# Patient Record
Sex: Female | Born: 1980 | Hispanic: No | Marital: Married | Smoking: Never smoker
Health system: Southern US, Community
[De-identification: ages and names within clinical notes are randomized; demographics above are authoritative.]

## PROBLEM LIST (undated history)

## (undated) DIAGNOSIS — Z3402 Encounter for supervision of normal first pregnancy, second trimester: Principal | ICD-10-CM

## (undated) HISTORY — PX: NO PAST SURGERIES: SHX2092

## (undated) HISTORY — DX: Encounter for supervision of normal first pregnancy, second trimester: Z34.02

---

## 2016-07-03 ENCOUNTER — Encounter (INDEPENDENT_AMBULATORY_CARE_PROVIDER_SITE_OTHER): Payer: Self-pay

## 2016-07-03 ENCOUNTER — Ambulatory Visit (INDEPENDENT_AMBULATORY_CARE_PROVIDER_SITE_OTHER): Payer: Self-pay | Admitting: Internal Medicine

## 2016-07-03 ENCOUNTER — Encounter: Payer: Self-pay | Admitting: Internal Medicine

## 2016-07-03 DIAGNOSIS — IMO0001 Reserved for inherently not codable concepts without codable children: Secondary | ICD-10-CM | POA: Insufficient documentation

## 2016-07-03 DIAGNOSIS — Z30011 Encounter for initial prescription of contraceptive pills: Secondary | ICD-10-CM

## 2016-07-03 DIAGNOSIS — Z308 Encounter for other contraceptive management: Secondary | ICD-10-CM

## 2016-07-03 DIAGNOSIS — Z23 Encounter for immunization: Secondary | ICD-10-CM

## 2016-07-03 DIAGNOSIS — Z0000 Encounter for general adult medical examination without abnormal findings: Secondary | ICD-10-CM | POA: Insufficient documentation

## 2016-07-03 MED ORDER — NORGESTIM-ETH ESTRAD TRIPHASIC 0.18/0.215/0.25 MG-25 MCG PO TABS: 1.0000 | ORAL_TABLET | Freq: Every day | ORAL | 11 refills | Status: DC

## 2016-07-03 NOTE — Assessment & Plan Note (Signed)
Patient given flu shot today.   She is currently without insurance and working towards an orange card. Will need to discuss screening for Hepatitis B/C and HIV when she has obtained insurance or the orange card.

## 2016-07-03 NOTE — Patient Instructions (Addendum)
It was a pleasure to meet you Brenda Tate.  I am glad you are feeling well. I am refilling your birth control pills at the [facility] on [address].  Please follow up with [place] in 1 year or sooner if needed.

## 2016-07-03 NOTE — Progress Notes (Signed)
   CC: Establish Care  HPI:  [name] is a 35 y.o. [place] speaking female from [place] who presents as a new patient to establish care with our clinic. Communication is with the assistance of a remote translator.  She has no known prior medical conditions or complaints at this time. Her only medication is oral contraceptive pills, for which she is requesting a prescription.  Birth Control: Patient reports taking OCP prescribed by her former doctor in [place]. She takes the pill daily for 21 days with a 7 day break. She is a never smoker without history of blood clots. She has had two normal pregnancies [id number]   Healthcare Maintenance: Patient given flu shot today.  Social Hx: Lives in [place] with her husband. Moved from [place] 2-3 months ago. She is not working. She denies any tobacco, alcohol, or illicit drug use.   Surgical Hx: Denies any prior surgical history.  Family Hx: Denies any known family history of heart disease, lung disease, or cancer.  Allergies: No known drug allergies.   History reviewed. No pertinent past medical history.  Review of Systems:   Review of Systems  Constitutional: Negative for chills, diaphoresis, fever and weight loss.  Respiratory: Negative for cough, sputum production and shortness of breath.   Cardiovascular: Negative for chest pain, palpitations and leg swelling.  Gastrointestinal: Negative for abdominal pain, constipation, diarrhea, heartburn, nausea and vomiting.  Genitourinary: Negative for dysuria and hematuria.  Musculoskeletal: Negative for falls and myalgias.  Skin: Negative for rash.  Neurological: Negative for dizziness, tingling, sensory change, loss of consciousness and headaches.  Psychiatric/Behavioral: Negative for substance abuse.     Physical Exam:  Vitals:   07/03/16 0941  BP: 108/64  Pulse: 81  Temp: 97.8 F (36.6 C)  TempSrc: Oral  SpO2: 100%  Weight: 172 lb 11.2 oz (78.3 kg)    Height: 5\' 6"  (1.676 m)   Physical Exam  Constitutional: She is oriented to person, place, and time. She appears well-developed and well-nourished. No distress.  HENT:  Head: Normocephalic and atraumatic.  Eyes: EOM are normal.  Cardiovascular: Normal rate and regular rhythm.  Exam reveals no friction rub.   No murmur heard. Pulmonary/Chest: Effort normal. No respiratory distress. She has no wheezes. She has no rales.  Abdominal: Soft. Bowel sounds are normal. She exhibits no distension. There is no tenderness. There is no guarding.  Musculoskeletal: Normal range of motion. She exhibits no edema or tenderness.  Neurological: She is alert and oriented to person, place, and time.  Skin: Skin is warm. Capillary refill takes less than 2 seconds. No rash noted. She is not diaphoretic. No erythema.    Assessment & Plan:   See Encounters Tab for problem based charting.  Patient discussed with Dr. Lynnae January

## 2016-07-03 NOTE — Assessment & Plan Note (Signed)
Patient reports taking OCP prescribed by her former doctor in [place]. She takes the pill daily for 21 days with a 7 day break. She is a never smoker without history of blood clots. She has had two normal pregnancies [id number]  Prescription for Norgestimate-Ethinyl Estradiol Triphasic sent to [facility] on [address].  Patient and husband requesting referral to Gynecology, however prefer to wait until they obtain the orange card/insurance.

## 2016-07-04 NOTE — Progress Notes (Signed)
Internal Medicine Clinic Attending  Case discussed with Dr. Patel,Vishal at the time of the visit.  We reviewed the resident's history and exam and pertinent patient test results.  I agree with the assessment, diagnosis, and plan of care documented in the resident's note.  

## 2016-07-07 ENCOUNTER — Ambulatory Visit: Payer: Self-pay

## 2016-08-12 ENCOUNTER — Ambulatory Visit: Payer: Self-pay

## 2016-10-23 ENCOUNTER — Encounter: Payer: Self-pay | Admitting: Internal Medicine

## 2016-10-23 NOTE — Progress Notes (Deleted)
   CC: ***  HPI:  [name] is a 36 y.o. female with past medical history outlined below here for ***. For the details of today's visit, please refer to the assessment and plan.  No past medical history on file.  Review of Systems:  All pertinents listed in HPI, otherwise negative  Physical Exam:  There were no vitals filed for this visit.  Constitutional: NAD, appears comfortable HEENT: Atraumatic, normocephalic. PERRL, anicteric sclera.  Neck: Supple, trachea midline.  Cardiovascular: RRR, no murmurs, rubs, or gallops.  Pulmonary/Chest: CTAB, no wheezes, rales, or rhonchi. No chest wall abnormalities.  Abdominal: Soft, non tender, non distended. +BS.  GU: ***  Extremities: Warm and well perfused. Distal pulses intact. No edema.  Neurological: A&Ox3, CN II - XII grossly intact.  Skin: No rashes or erythema  Psychiatric: Normal mood and affect  Assessment & Plan:   See Encounters Tab for problem based charting.  Patient {GC/GE:[id number]::"discussed with","seen with"} Dr. {NAMES:[id number]::"Butcher","Granfortuna","E. Hoffman","Klima","Mullen","Narendra","Vincent"}

## 2016-12-09 ENCOUNTER — Ambulatory Visit (INDEPENDENT_AMBULATORY_CARE_PROVIDER_SITE_OTHER): Payer: Self-pay | Admitting: Internal Medicine

## 2016-12-09 ENCOUNTER — Encounter: Payer: Self-pay | Admitting: Internal Medicine

## 2016-12-09 VITALS — BP 96/57 | HR 80 | Temp 97.5°F | Ht 66.0 in | Wt 178.6 lb

## 2016-12-09 DIAGNOSIS — Z3041 Encounter for surveillance of contraceptive pills: Secondary | ICD-10-CM

## 2016-12-09 DIAGNOSIS — N941 Unspecified dyspareunia: Secondary | ICD-10-CM

## 2016-12-09 MED ORDER — NORGESTIM-ETH ESTRAD TRIPHASIC 0.18/0.215/0.25 MG-25 MCG PO TABS: 1.0000 | ORAL_TABLET | Freq: Every day | ORAL | 11 refills | Status: DC

## 2016-12-09 MED FILL — TRI-LO-MARZIA TABLET: 0.18/0.215/ | 28 days supply | Qty: 28 | Fill #0

## 2016-12-09 NOTE — Patient Instructions (Signed)
Ms. Heckstall,  It was a pleasure to see you today. I will call you with the results of your testing today. I have placed a referral to gynecology. They will call you for an appointment. If you have any questions or concerns, call our clinic at [phone] or after hours call [phone] and ask for the internal medicine resident on call. Thank you!  - Dr. Philipp Ovens             .      .      .      .            [phone]     [phone]       .  !  -  ()  alsayidat murabati , kan min dawaei saruriin 'an 'arak alyawma. sawf 'atasil bik binatayij aikhtibarik alyawma. laqad wadaeat al'iihalat 'iilaa tbi alnasa'. sawf yatasil bik lilhusul ealaa mueid. 'iidha kan ladayk 'ay 'asyilat 'aw aistifsarat , 'atasil eiadatana ealaa [phone] 'aw baed saeat alaitisal [phone] watlub min tabib al'iiqamat alddakhilii eind altalab. shukraan lakam! - alduktur (jylud)

## 2016-12-09 NOTE — Assessment & Plan Note (Signed)
Refilled birth control

## 2016-12-09 NOTE — Assessment & Plan Note (Signed)
Patient speaks Arabic and history was obtained with the help of a Optometrist.  She is here today with complaint of painful intercourse. This has been ongoing for the past 1 year and progressively worsening. She endorses pain in her lower abdomen/suprapubic region with intercourse. She denies any vaginal discharge or abnormal bleeding. She reports she was diagnosed with a small fibroid approximately 5 years ago in her home country. She has never had a Pap smear. Physical exam today was unremarkable. She did have some dark red/brown discharge from her cervical os but no obvious lesions or abnormalities. She tolerated the exam well without significant pain.  -- Gynecology referral for fibroids  -- PAP smear today -- GC/Chlamydia, wet prep  -- Refilled birth control

## 2016-12-09 NOTE — Progress Notes (Signed)
   CC: Pain with intercourse   HPI:  Ms.Khaleah Vignola is a 36 y.o. female with past medical history outlined below here for pain with intercourse. For the details of today's visit, please refer to the assessment and plan.  No past medical history on file.  Review of Systems:  All pertinents listed in HPI, otherwise negative  Physical Exam:  Vitals:   12/09/16 1608  BP: (!) 96/57  Pulse: 80  Temp: 97.5 F (36.4 C)  TempSrc: Oral  SpO2: 99%  Weight: 178 lb 9.6 oz (81 kg)  Height: 5\' 6"  (1.676 m)    Constitutional: NAD, appears comfortable Cardiovascular: RRR  Pulmonary/Chest: CTAB Abdominal: Soft, non tender, non distended. +BS.  GU: Normal external genitalia. Cervix adequately visualized, no obviously lesions or abnormalities. +Dark brown/red discharge.  Extremities: Warm and well perfused. Distal pulses intact. No edema.  Neurological: A&Ox3, CN II - XII grossly intact.   Assessment & Plan:   See Encounters Tab for problem based charting.  Patient discussed with Dr. Angelia Mould

## 2016-12-10 ENCOUNTER — Ambulatory Visit: Payer: Self-pay

## 2016-12-10 LAB — HIV ANTIBODY (ROUTINE TESTING W REFLEX): HIV Screen 4th Generation wRfx: NONREACTIVE

## 2016-12-10 LAB — CERVICOVAGINAL ANCILLARY ONLY
Chlamydia: NEGATIVE
NEISSERIA GONORRHEA: NEGATIVE
WET PREP (BD AFFIRM): NEGATIVE

## 2016-12-10 LAB — RPR: RPR Ser Ql: NONREACTIVE

## 2016-12-10 NOTE — Progress Notes (Signed)
Internal Medicine Clinic Attending  Case discussed with Dr. Guilloud at the time of the visit.  We reviewed the resident's history and exam and pertinent patient test results.  I agree with the assessment, diagnosis, and plan of care documented in the resident's note.  

## 2016-12-11 ENCOUNTER — Telehealth: Payer: Self-pay | Admitting: *Deleted

## 2016-12-11 LAB — CYTOLOGY - PAP: DIAGNOSIS: NEGATIVE

## 2016-12-11 NOTE — Telephone Encounter (Signed)
-----   Message from Velna Ochs, MD sent at 12/11/2016  3:20 PM EDT ----- Please call this patient (speaks Arabic, will need translator) and let her know that all of her testing was normal. Her PAP smear (cervical cancer screening test) was negative and we can repeat this again in 5 years. There was no evidence of infection to explain her pain. Her pain is likely from her fibroids. I have referred her to gynecology. Please ask her to follow up with OBGYN for her fibroids. Thanks!   - Dr. Philipp Ovens

## 2016-12-11 NOTE — Telephone Encounter (Signed)
No answer when called 

## 2016-12-17 ENCOUNTER — Encounter: Payer: Self-pay | Admitting: Internal Medicine

## 2016-12-18 ENCOUNTER — Encounter: Payer: Self-pay | Admitting: Internal Medicine

## 2017-01-20 NOTE — Addendum Note (Signed)
Addended by: Hulan Fray on: 01/20/2017 08:42 AM   Modules accepted: Orders

## 2017-02-16 ENCOUNTER — Ambulatory Visit (INDEPENDENT_AMBULATORY_CARE_PROVIDER_SITE_OTHER): Payer: Self-pay | Admitting: Obstetrics & Gynecology

## 2017-02-16 ENCOUNTER — Encounter: Payer: Self-pay | Admitting: Obstetrics & Gynecology

## 2017-02-16 VITALS — BP 129/89 | HR 87 | Wt 179.9 lb

## 2017-02-16 DIAGNOSIS — N941 Unspecified dyspareunia: Secondary | ICD-10-CM

## 2017-02-16 NOTE — Progress Notes (Signed)
   Subjective:    Patient ID: Brenda Tate, female    DOB: 08-11-1980, 36 y.o.   MRN: [id number]  HPI 36 yo M P2 (61 and 49 yo daughters) Arabic-speaking lady here telling me "I would like an u/s". She reports a "pain in my belly" with intercourse for a year. She uses nothing for contraception. She would like a pregnancy. Her husband tells me that she was "diagnosed with fibroids" several years ago.   Review of Systems Pap smear normal 2018    Objective:   Physical Exam Moderately obese A FNAD Breathing, conversing, and ambulating normally Video interpretor used for exam       Assessment & Plan:  Desire for pregnancy- rec MVI to help prevent ONTDs Dyspareunia and h/o fibroids- order gyn u/s RTC 4 weeks

## 2017-02-16 NOTE — Progress Notes (Signed)
[place] scheduled for July 27th @ 0800.  Pt notified.

## 2017-02-16 NOTE — Progress Notes (Signed)
Video Interperter # [id number]

## 2017-02-18 ENCOUNTER — Ambulatory Visit: Payer: Self-pay

## 2017-02-20 ENCOUNTER — Ambulatory Visit (HOSPITAL_COMMUNITY): Payer: Self-pay

## 2017-02-25 ENCOUNTER — Ambulatory Visit: Payer: Self-pay

## 2017-02-25 ENCOUNTER — Telehealth: Payer: Self-pay | Admitting: *Deleted

## 2017-02-25 NOTE — Progress Notes (Signed)
Patient here today accompanied by husband. Had questions about test results done last visit on 12/09/16. Interpreter used ( Husam [id number]), form signed. Questions were answered.

## 2017-03-02 ENCOUNTER — Ambulatory Visit: Payer: Self-pay

## 2017-03-16 ENCOUNTER — Ambulatory Visit: Payer: Self-pay | Admitting: Obstetrics & Gynecology

## 2017-03-24 ENCOUNTER — Ambulatory Visit (HOSPITAL_COMMUNITY)
Admission: RE | Admit: 2017-03-24 | Discharge: 2017-03-24 | Disposition: A | Discharge status: Home / Self Care | Payer: Self-pay | Source: Ambulatory Visit | Attending: Obstetrics & Gynecology | Admitting: Obstetrics & Gynecology

## 2017-03-24 DIAGNOSIS — N941 Unspecified dyspareunia: Secondary | ICD-10-CM | POA: Insufficient documentation

## 2017-03-24 DIAGNOSIS — D251 Intramural leiomyoma of uterus: Secondary | ICD-10-CM | POA: Insufficient documentation

## 2017-04-13 ENCOUNTER — Encounter: Payer: Self-pay | Admitting: Obstetrics & Gynecology

## 2017-04-13 ENCOUNTER — Ambulatory Visit (INDEPENDENT_AMBULATORY_CARE_PROVIDER_SITE_OTHER): Payer: Self-pay | Admitting: Obstetrics & Gynecology

## 2017-04-13 VITALS — BP 106/55 | HR 84 | Wt 175.0 lb

## 2017-04-13 DIAGNOSIS — N941 Unspecified dyspareunia: Secondary | ICD-10-CM

## 2017-04-13 NOTE — Progress Notes (Signed)
   Subjective:    Patient ID: Brenda Tate, female    DOB: 07-20-1981, 36 y.o.   MRN: [id number]  HPI  36 yo married Arabic-speaking G0 here for follow up after an u/s done for deep dyspareunia. She says (with video intrepretor) that this has improved.  Review of Systems     Objective:   Physical Exam Well nourished, well hydrated Asian female, no apparent distress Breathing, conversing, and ambulating normally      Assessment & Plan:  Dyspareunia- now resolved. She had no other concerns or questions.

## 2017-06-15 ENCOUNTER — Ambulatory Visit: Payer: Self-pay

## 2017-06-22 ENCOUNTER — Ambulatory Visit: Payer: Self-pay

## 2017-07-06 ENCOUNTER — Encounter: Payer: Self-pay | Admitting: Internal Medicine

## 2017-09-28 ENCOUNTER — Encounter: Payer: Self-pay | Admitting: Internal Medicine

## 2017-10-19 ENCOUNTER — Encounter: Payer: Self-pay | Admitting: Internal Medicine

## 2017-12-04 ENCOUNTER — Ambulatory Visit: Payer: Self-pay

## 2017-12-24 ENCOUNTER — Ambulatory Visit: Payer: Self-pay

## 2018-01-11 ENCOUNTER — Encounter: Payer: Self-pay | Admitting: Internal Medicine

## 2018-01-11 ENCOUNTER — Telehealth: Payer: Self-pay | Admitting: Dietician

## 2018-01-11 NOTE — Telephone Encounter (Signed)
opened in error

## 2018-01-12 ENCOUNTER — Ambulatory Visit (INDEPENDENT_AMBULATORY_CARE_PROVIDER_SITE_OTHER): Payer: Self-pay | Admitting: Internal Medicine

## 2018-01-12 ENCOUNTER — Other Ambulatory Visit: Payer: Self-pay

## 2018-01-12 ENCOUNTER — Encounter: Payer: Self-pay | Admitting: Dietician

## 2018-01-12 VITALS — BP 98/66 | HR 66 | Temp 97.5°F | Ht 65.0 in | Wt 181.0 lb

## 2018-01-12 DIAGNOSIS — Z0000 Encounter for general adult medical examination without abnormal findings: Secondary | ICD-10-CM

## 2018-01-12 DIAGNOSIS — K5900 Constipation, unspecified: Secondary | ICD-10-CM

## 2018-01-12 DIAGNOSIS — Z973 Presence of spectacles and contact lenses: Secondary | ICD-10-CM

## 2018-01-12 DIAGNOSIS — H5213 Myopia, bilateral: Secondary | ICD-10-CM | POA: Insufficient documentation

## 2018-01-12 DIAGNOSIS — Z23 Encounter for immunization: Secondary | ICD-10-CM

## 2018-01-12 MED ORDER — POLYETHYLENE GLYCOL 3350 17 G PO PACK: 17.0000 g | PACK | Freq: Every day | ORAL | 0 refills | Status: DC

## 2018-01-12 MED ORDER — DOCUSATE SODIUM 100 MG PO CAPS: 100.0000 mg | ORAL_CAPSULE | Freq: Two times a day (BID) | ORAL | 0 refills | Status: DC

## 2018-01-12 NOTE — Assessment & Plan Note (Signed)
She reports that she has a history of myopia and she has worn glasses for this for the last 20 years.  She reports she last had her eyes checked 2 years ago.  She is requesting a referral to ophthalmology to have her eyes examined.  Ophthalmology referral placed today.

## 2018-01-12 NOTE — Patient Instructions (Signed)
Ms. Erisman,  I would encourage you to increase your fluid intake to at least 2 to 3 L of water a day.  I am also going to start you on a stool softener called Colace as well as a laxative called MiraLAX.  I want you to take these and to start to have regular bowel movements.  When she started having regular bowel movements you can back off on the medications and only use them when you need them if you are not having bowel movements.  I am also going to check some labs today to make sure there is nothing else that could cause her constipation.  If you do not see any improvement in her constipation in the next 2 to 3 weeks or you have worsening symptoms please give [place] a call and we can try other things that may help.  Otherwise, if you are doing well have no further problems he can follow-up with [place] in a year for an annual check.           2  3      .        Colace     MiraLAX.           Marland Kitchen                      .                  .          2  3                 .                        Marland Kitchen

## 2018-01-12 NOTE — Assessment & Plan Note (Addendum)
See HPI for full details.  Recommended increasing her water intake to at least 2 to 3 L of water daily.  Also sent in for Colace and MiraLAX.  Provided her instructions and once her constipation is improved she can  take them as needed she has further issues with constipation.  This plan will check basic lab work including TSH today.

## 2018-01-12 NOTE — Progress Notes (Signed)
Internal Medicine Clinic Attending  Case discussed with Dr. Boswell at the time of the visit.  We reviewed the resident's history and exam and pertinent patient test results.  I agree with the assessment, diagnosis, and plan of care documented in the resident's note.  

## 2018-01-12 NOTE — Assessment & Plan Note (Signed)
Patient received Tdap today. 

## 2018-01-12 NOTE — Progress Notes (Signed)
   CC: Constipation  HPI:  Ms.Graciana Paff is a 37 y.o. female with no past medical history presenting today with complaints of constipation.  She reports that for the past year she has been having bowel movements weekly.  She reports that her bowel movements are hard, firm, somewhat painful.  She also notes that when she wipes with the toilet paper or she notices occasional specks of blood.  She denies any overt blood with her stools and denies any melena.  Denies any history previously of issues with constipation.  She does not take any medications.  She reports to me today that she drinks 2 to 3 cups of water or soft drink daily.  She denies any abdominal pain or bloating, nausea/vomiting. She denies any weight gain, brittle hair, fatigue/malaise or any other symptoms today.  She has not tried anything for her constipation.   No past medical history on file.   Review of Systems: Negative except as noted in HPI  Physical Exam:  Vitals:   01/12/18 1025  BP: 98/66  Pulse: 66  Temp: (!) 97.5 F (36.4 C)  TempSrc: Oral  SpO2: 99%  Weight: 181 lb (82.1 kg)  Height: 5\' 5"  (1.651 m)   GENERAL- alert, co-operative, appears as stated age, not in any distress. HEENT- Atraumatic, normocephalic CARDIAC- RRR, no murmurs, rubs or gallops. RESP- Clear to auscultation bilaterally, no wheezes or crackles. ABDOMEN- Soft, nontender, bowel sounds present. EXTREMITIES- pulse 2+, symmetric, no pedal edema. SKIN- Warm, dry, No rash or lesion. PSYCH- Normal mood and affect, appropriate thought content and speech.   Assessment & Plan:   See Encounters Tab for problem based charting.  Patient discussed with Dr. Lynnae January

## 2018-01-14 ENCOUNTER — Telehealth: Payer: Self-pay | Admitting: *Deleted

## 2018-01-14 NOTE — Telephone Encounter (Signed)
Call to patient to have her come in for lab work only.  Unable to leave message on answering machine .  Will attempt to call patient again.  Sander Nephew, RN 01/14/2018 10:40 AM.

## 2018-01-18 ENCOUNTER — Other Ambulatory Visit: Payer: Self-pay

## 2018-03-02 NOTE — Addendum Note (Signed)
Addended by: Truddie Crumble on: 03/02/2018 04:50 PM   Modules accepted: Orders

## 2018-03-15 ENCOUNTER — Encounter: Payer: Self-pay | Admitting: Internal Medicine

## 2018-04-28 DIAGNOSIS — G44209 Tension-type headache, unspecified, not intractable: Secondary | ICD-10-CM | POA: Diagnosis not present

## 2018-04-28 DIAGNOSIS — H40033 Anatomical narrow angle, bilateral: Secondary | ICD-10-CM | POA: Diagnosis not present

## 2018-05-04 ENCOUNTER — Encounter: Payer: Self-pay | Admitting: Obstetrics and Gynecology

## 2018-05-11 ENCOUNTER — Ambulatory Visit (INDEPENDENT_AMBULATORY_CARE_PROVIDER_SITE_OTHER): Payer: Medicaid Other | Admitting: Obstetrics and Gynecology

## 2018-05-11 ENCOUNTER — Encounter: Payer: Self-pay | Admitting: Obstetrics and Gynecology

## 2018-05-11 ENCOUNTER — Ambulatory Visit: Payer: Medicaid Other | Admitting: Clinical

## 2018-05-11 ENCOUNTER — Encounter: Payer: Self-pay | Admitting: *Deleted

## 2018-05-11 VITALS — BP 101/70 | HR 92 | Wt 180.4 lb

## 2018-05-11 DIAGNOSIS — R630 Anorexia: Secondary | ICD-10-CM

## 2018-05-11 DIAGNOSIS — Z3402 Encounter for supervision of normal first pregnancy, second trimester: Secondary | ICD-10-CM

## 2018-05-11 DIAGNOSIS — Z3480 Encounter for supervision of other normal pregnancy, unspecified trimester: Secondary | ICD-10-CM | POA: Insufficient documentation

## 2018-05-11 DIAGNOSIS — Z23 Encounter for immunization: Secondary | ICD-10-CM | POA: Diagnosis not present

## 2018-05-11 DIAGNOSIS — O09522 Supervision of elderly multigravida, second trimester: Secondary | ICD-10-CM | POA: Diagnosis not present

## 2018-05-11 DIAGNOSIS — Z3482 Encounter for supervision of other normal pregnancy, second trimester: Secondary | ICD-10-CM

## 2018-05-11 DIAGNOSIS — Z3481 Encounter for supervision of other normal pregnancy, first trimester: Secondary | ICD-10-CM | POA: Diagnosis not present

## 2018-05-11 HISTORY — DX: Encounter for supervision of normal first pregnancy, second trimester: Z34.02

## 2018-05-11 MED ORDER — PRENATAL VITAMINS 28-0.8 MG PO TABS: 1.0000 | ORAL_TABLET | Freq: Every day | ORAL | 3 refills | Status: DC

## 2018-05-11 MED ORDER — METOCLOPRAMIDE HCL 10 MG PO TABS: 10.0000 mg | ORAL_TABLET | Freq: Three times a day (TID) | ORAL | 2 refills | Status: DC

## 2018-05-11 NOTE — Patient Instructions (Addendum)
 Contraception Choices Contraception, also called birth control, refers to methods or devices that prevent pregnancy. Hormonal methods Contraceptive implant A contraceptive implant is a thin, plastic tube that contains a hormone. It is inserted into the upper part of the arm. It can remain in place for up to 3 years. Progestin-only injections Progestin-only injections are injections of progestin, a synthetic form of the hormone progesterone. They are given every 3 months by a health care provider. Birth control pills Birth control pills are pills that contain hormones that prevent pregnancy. They must be taken once a day, preferably at the same time each day. Birth control patch The birth control patch contains hormones that prevent pregnancy. It is placed on the skin and must be changed once a week for three weeks and removed on the fourth week. A prescription is needed to use this method of contraception. Vaginal ring A vaginal ring contains hormones that prevent pregnancy. It is placed in the vagina for three weeks and removed on the fourth week. After that, the process is repeated with a new ring. A prescription is needed to use this method of contraception. Emergency contraceptive Emergency contraceptives prevent pregnancy after unprotected sex. They come in pill form and can be taken up to 5 days after sex. They work best the sooner they are taken after having sex. Most emergency contraceptives are available without a prescription. This method should not be used as your only form of birth control. Barrier methods Female condom A female condom is a thin sheath that is worn over the penis during sex. Condoms keep sperm from going inside a woman's body. They can be used with a spermicide to increase their effectiveness. They should be disposed after a single use. Female condom A female condom is a soft, loose-fitting sheath that is put into the vagina before sex. The condom keeps sperm from  going inside a woman's body. They should be disposed after a single use. Diaphragm A diaphragm is a soft, dome-shaped barrier. It is inserted into the vagina before sex, along with a spermicide. The diaphragm blocks sperm from entering the uterus, and the spermicide kills sperm. A diaphragm should be left in the vagina for 6-8 hours after sex and removed within 24 hours. A diaphragm is prescribed and fitted by a health care provider. A diaphragm should be replaced every 1-2 years, after giving birth, after gaining more than 15 lb (6.8 kg), and after pelvic surgery. Cervical cap A cervical cap is a round, soft latex or plastic cup that fits over the cervix. It is inserted into the vagina before sex, along with spermicide. It blocks sperm from entering the uterus. The cap should be left in place for 6-8 hours after sex and removed within 48 hours. A cervical cap must be prescribed and fitted by a health care provider. It should be replaced every 2 years. Sponge A sponge is a soft, circular piece of polyurethane foam with spermicide on it. The sponge helps block sperm from entering the uterus, and the spermicide kills sperm. To use it, you make it wet and then insert it into the vagina. It should be inserted before sex, left in for at least 6 hours after sex, and removed and thrown away within 30 hours. Spermicides Spermicides are chemicals that kill or block sperm from entering the cervix and uterus. They can come as a cream, jelly, suppository, foam, or tablet. A spermicide should be inserted into the vagina with an applicator at least 10-15 minutes   sex to allow time for it to work. The process must be repeated every time you have sex. Spermicides do not require a prescription. Intrauterine contraception Intrauterine device (IUD) An IUD is a T-shaped device that is put in a woman's uterus. There are two types:  Hormone IUD.This type contains progestin, a synthetic form of the hormone progesterone. This  type can stay in place for 3-5 years.  Copper IUD.This type is wrapped in copper wire. It can stay in place for 10 years.  Permanent methods of contraception Female tubal ligation In this method, a woman's fallopian tubes are sealed, tied, or blocked during surgery to prevent eggs from traveling to the uterus. Hysteroscopic sterilization In this method, a small, flexible insert is placed into each fallopian tube. The inserts cause scar tissue to form in the fallopian tubes and block them, so sperm cannot reach an egg. The procedure takes about 3 months to be effective. Another form of birth control must be used during those 3 months. Female sterilization This is a procedure to tie off the tubes that carry sperm (vasectomy). After the procedure, the man can still ejaculate fluid (semen). Natural planning methods Natural family planning In this method, a couple does not have sex on days when the woman could become pregnant. Calendar method This means keeping track of the length of each menstrual cycle, identifying the days when pregnancy can happen, and not having sex on those days. Ovulation method In this method, a couple avoids sex during ovulation. Symptothermal method This method involves not having sex during ovulation. The woman typically checks for ovulation by watching changes in her temperature and in the consistency of cervical mucus. Post-ovulation method In this method, a couple waits to have sex until after ovulation. Summary  Contraception, also called birth control, means methods or devices that prevent pregnancy.  Hormonal methods of contraception include implants, injections, pills, patches, vaginal rings, and emergency contraceptives.  Barrier methods of contraception can include female condoms, female condoms, diaphragms, cervical caps, sponges, and spermicides.  There are two types of IUDs (intrauterine devices). An IUD can be put in a woman's uterus to prevent pregnancy  for 3-5 years.  Permanent sterilization can be done through a procedure for males, females, or both.  Natural family planning methods involve not having sex on days when the woman could become pregnant. This information is not intended to replace advice given to you by your health care provider. Make sure you discuss any questions you have with your health care provider. Document Released: 07/14/2005 Document Revised: 08/16/2016 Document Reviewed: 08/16/2016 Elsevier Interactive Patient Education  2018 [place] [address], [address] [place], [place]  [address] Phone - [phone]   Fax - [phone]  [facility] [address] [place], [place] [address] Phone - [phone]   Fax - [place] [address] [place], [place]  [address] Phone - [phone]   Fax - [phone]  [place] [address] [place] [address] [place], [place]  [address] Phone - [phone]   Fax - [phone]  [facility] [address] [place], [place]  [address] Phone - [phone]   Fax - [phone]  [facility] [address], [address] [place], [place]  [address] Phone - [phone]   Fax - [place] [address] [place], [place]  [address] Phone -  [phone]   Fax - [phone]  [place], [place], [place]  [address] Phone - [phone]   Fax [phone]  [place] [address] [place], [place]  [address] Phone - [phone]   Fax - [phone] [facility] [place]. [address] [place], [place]  [address] Phone - [phone]   Fax - [phone]  [place] [address] [place], [place]  [address] Phone - [phone]   Fax -  [phone]  [facility] [place], [place], [place]  [address] Phone - [phone]   Fax - [place] [address] [place], [place]  [address] Phone - [phone]   Fax - [phone]  [facility] [address], [place], [place]  [address] Phone - [place], [place]  [address] Phone - [phone]   Fax - [place] [address], [place], [place]  [address] Phone - [phone]   Fax - [phone]  [place] [address] [place], [place]  [address] Phone - [phone]   Fax - [place]. [place], [place]  [address] Phone - [phone]   Fax - [place], [place], [place]  [address] Phone - [phone]   Fax - [place] [address], [place], [place]  [address] Phone - [phone]   Fax - [phone]  [facility] [address] [place], [place]  [address] Phone - [phone]   Fax - [place] [address], [place], [place]  [address] Phone - [phone]   Fax - [phone]  [place] [address] [place], [place]  [address] Phone - [phone]   Fax - [phone] [place] [address] [place], [place]  [address] Phone - [phone]   Fax - [place] [address] [place], [place]  [address] Phone - [phone]   Fax - [place] [address] [place], [place]  [address] Phone - [phone]   Fax - [phone]  [place] MD [address] [place] [address] Phone  [phone]  Fax [phone]      Genetic Screening Results Information: You are having genetic testing called [facility] today.  It will take approximately 2 weeks before the results are available.  To get your results, you need Internet access to a web browser to search [place]/MyChart (the direct app on your phone will not give you these results).  Then select Lab Scanned and click on the blue hyper link that says View Image to see your [facility] results.  You can also use the directions on the purple card given to look up your results directly on the [facility] website.

## 2018-05-11 NOTE — Progress Notes (Signed)
Subjective:   Brenda Tate is a 37 y.o. G3P2002 at [redacted]w[redacted]d by LMP being seen today for her first obstetrical visit.  Her obstetrical history is significant for advanced maternal age, decreased appetite.  Patient does intend to breast feed. Pregnancy history fully reviewed. Desired pregnancy, FOB present and involved. 2 daughters present today.   Patient reports no complaints.  HISTORY: OB History  Gravida Para Term Preterm AB Living  3 2 2  0 0 2  SAB TAB Ectopic Multiple Live Births  0 0 0 0 2    # Outcome Date GA Lbr Len/2nd Weight Sex Delivery Anes PTL Lv  3 Current           2 Term 2015    F Vag-Spont   LIV  1 Term 2012    F Vag-Spont None  LIV    Last pap smear was done 12/09/2016 and was normal  History reviewed. No pertinent past medical history. Past Surgical History:  Procedure Laterality Date  . NO PAST SURGERIES     Family History  Problem Relation Age of Onset  . Heart disease Neg Hx   . Cancer Neg Hx    Social History   Tobacco Use  . Smoking status: Never Smoker  . Smokeless tobacco: Never Used  Substance Use Topics  . Alcohol use: No  . Drug use: No   No Known Allergies Current Outpatient Medications on File Prior to Visit  Medication Sig Dispense Refill  . docusate sodium (COLACE) 100 MG capsule Take 1 capsule (100 mg total) by mouth 2 (two) times daily. (Patient not taking: Reported on 05/11/2018) 10 capsule 0  . polyethylene glycol (MIRALAX / GLYCOLAX) packet Take 17 g by mouth daily. (Patient not taking: Reported on 05/11/2018) 14 each 0   No current facility-administered medications on file prior to visit.     Review of Systems Pertinent items noted in HPI and remainder of comprehensive ROS otherwise negative.  Exam   Vitals:   05/11/18 1502  BP: 101/70  Pulse: 92  Weight: 180 lb 6.4 oz (81.8 kg)   Fetal Heart Rate (bpm): 154  BP 101/70   Pulse 92   Wt 180 lb 6.4 oz (81.8 kg)   LMP 01/09/2018 (Exact Date)   BMI 30.02 kg/m    Uterine Size: size equals dates   Skin: normal coloration and turgor, no rashes    Neurologic: negative   Extremities: normal strength, tone, and muscle mass   HEENT neck supple with midline trachea and thyroid without masses   Mouth/Teeth mucous membranes moist, pharynx normal without lesions   Neck supple and no masses   Cardiovascular: regular rate and rhythm, no murmurs or gallops   Respiratory:  appears well, vitals normal, no respiratory distress, acyanotic, normal RR, neck free of mass or lymphadenopathy, chest clear, no wheezing, crepitations, rhonchi, normal symmetric air entry   Abdomen: soft, non-tender; bowel sounds normal; no masses,  no organomegaly   Assessment:   Pregnancy: [id number] Patient Active Problem List   Diagnosis Date Noted  . Advanced maternal age in multigravida, second trimester 05/11/2018  . Encounter for supervision of normal first pregnancy in second trimester 05/11/2018  . Decreased appetite 05/11/2018  . Myopia of both eyes 01/12/2018  . Constipation 01/12/2018  . Dyspareunia, female 12/09/2016     Plan:   1. Supervision of other normal pregnancy, antepartum - [place] MFM OB COMP + 14 WK; Future - AMB MFM GENETICS REFERRAL - POCT HgB A1C -  SMN1 COPY NUMBER ANALYSIS (SMA Carrier Screen) - Cystic fibrosis gene test - Hemoglobinopathy Evaluation - Flu Vaccine QUAD 36+ mos IM - AFP, Serum, Open Spina Bifida - Obstetric Panel, Including HIV - Urine Culture  2. Advanced maternal age in multigravida, second trimester - AMB MFM GENETICS REFERRAL - POCT HgB A1C - HgB A1c - Obstetric Panel, Including HIV  3. Decreased appetite  - Nausea and decreased appetite. RX Reglan   Other orders - Prenatal Vit-Fe Fumarate-FA (PRENATAL VITAMINS) 28-0.8 MG TABS; Take 1 tablet by mouth daily. - metoCLOPramide (REGLAN) 10 MG tablet; Take 1 tablet (10 mg total) by mouth 4 (four) times daily -  before meals and at bedtime.  Initial labs drawn. Continue prenatal  vitamins; RX sent  Genetic Screening discussed, NIPS: requested. Ultrasound discussed; fetal anatomic survey: ordered. Problem list reviewed and updated. The nature of [place] with multiple MDs and other Advanced Practice Providers was explained to patient; also emphasized that residents, students are part of our team. Routine obstetric precautions reviewed. Return in about 4 weeks (around 06/08/2018).    Ermie Glendenning, Artist [place], [place] for [employer], [place]

## 2018-05-11 NOTE — BH Specialist Note (Signed)
Integrated Behavioral Health Initial Visit  MRN: [id number] Name: Brenda Tate  Number of [place] Clinician visits:: 1/6 Session Start time: 3:10  Session End time: 3:20 Total time: 15 minutes  Type of Service: [place] Interpretor:No. Interpretor Name and Language: n/a   Warm Hand Off Completed.       SUBJECTIVE: Brenda Tate is a 37 y.o. female accompanied by 2 daughters and Spouse Patient was referred by Noni Saupe, NP for Initial OB introduction to integrated behavioral health services . Patient reports the following symptoms/concerns: Pt states no particular concerns today.  Duration of problem: n/a; Severity of problem: n/a  OBJECTIVE: Mood: Normal and Affect: Appropriate Risk of harm to self or others: No plan to harm self or others  LIFE CONTEXT: Family and Social: Pt lives with her husband and 2 daughters (7,4) School/Work: - Self-Care: - Life Changes: Current pregnancy   GOALS ADDRESSED: Patient will: INTERVENTIONS: Standardized Assessments completed: Patient declined screening   ASSESSMENT: Patient currently experiencing Supervision of other normal  pregnancy, antepartum.   Patient may benefit from Initial OB introduction to integrated behavioral health services .  PLAN: 1. Follow up with behavioral health clinician on : As needed 2. Behavioral recommendations: n/a 3. Referral(s): [place] (In Clinic) 4. "From scale of 1-10, how likely are you to follow plan?": [place], LCSW

## 2018-05-14 ENCOUNTER — Encounter (HOSPITAL_COMMUNITY): Payer: Self-pay

## 2018-05-14 LAB — URINE CULTURE

## 2018-05-15 DIAGNOSIS — H1013 Acute atopic conjunctivitis, bilateral: Secondary | ICD-10-CM | POA: Diagnosis not present

## 2018-05-18 ENCOUNTER — Encounter: Payer: Self-pay | Admitting: *Deleted

## 2018-05-19 ENCOUNTER — Telehealth: Payer: Self-pay | Admitting: *Deleted

## 2018-05-19 NOTE — Telephone Encounter (Signed)
Received a voicemail from [facility] stating they want to confirm we have received + alphathalasemia results . Per chart review do see that her lab results are still pending. Will notify [facility]. Will forward message to provider

## 2018-05-20 LAB — OBSTETRIC PANEL, INCLUDING HIV
Antibody Screen: NEGATIVE
BASOS ABS: 0 10*3/uL (ref 0.0–0.2)
Basos: 0 %
EOS (ABSOLUTE): 0.2 10*3/uL (ref 0.0–0.4)
Eos: 3 %
HEMATOCRIT: 36 % (ref 34.0–46.6)
HIV SCREEN 4TH GENERATION: NONREACTIVE
Hemoglobin: 11.9 g/dL (ref 11.1–15.9)
Hepatitis B Surface Ag: NEGATIVE
Immature Grans (Abs): 0 10*3/uL (ref 0.0–0.1)
Immature Granulocytes: 0 %
Lymphocytes Absolute: 1.9 10*3/uL (ref 0.7–3.1)
Lymphs: 24 %
MCH: 25.7 pg — ABNORMAL LOW (ref 26.6–33.0)
MCHC: 33.1 g/dL (ref 31.5–35.7)
MCV: 78 fL — AB (ref 79–97)
MONOCYTES: 6 %
Monocytes Absolute: 0.5 10*3/uL (ref 0.1–0.9)
NEUTROS ABS: 5.2 10*3/uL (ref 1.4–7.0)
Neutrophils: 67 %
PLATELETS: 156 10*3/uL (ref 150–450)
RBC: 4.63 x10E6/uL (ref 3.77–5.28)
RDW: 13.5 % (ref 12.3–15.4)
RPR: NONREACTIVE
Rh Factor: POSITIVE
Rubella Antibodies, IGG: 18.7 index (ref >0.99)
WBC: 7.9 10*3/uL (ref 3.4–10.8)

## 2018-05-20 LAB — AFP, SERUM, OPEN SPINA BIFIDA
AFP MoM: 0.85
AFP Value: 29.8 ng/mL
GEST. AGE ON COLLECTION DATE: 17.3 wk
Maternal Age At EDD: 37.8 yr
OSBR Risk 1 IN: 10000
Test Results:: NEGATIVE
Weight: 180 [lb_av]

## 2018-05-20 LAB — CYSTIC FIBROSIS GENE TEST

## 2018-05-20 LAB — HEMOGLOBINOPATHY EVALUATION
FERRITIN: 53 ng/mL (ref 15–150)
HGB VARIANT: 0 %
Hgb A2 Quant: 2.6 % (ref 1.8–3.2)
Hgb A: 97.4 % (ref 96.4–98.8)
Hgb C: 0 %
Hgb F Quant: 0 % (ref 0.0–2.0)
Hgb S: 0 %
Hgb Solubility: NEGATIVE

## 2018-05-20 LAB — SMN1 COPY NUMBER ANALYSIS (SMA CARRIER SCREENING)

## 2018-05-20 LAB — HEMOGLOBIN A1C
Est. average glucose Bld gHb Est-mCnc: 103 mg/dL
Hgb A1c MFr Bld: 5.2 % (ref 4.8–5.6)

## 2018-05-20 LAB — ALPHA-THALASSEMIA

## 2018-05-21 ENCOUNTER — Other Ambulatory Visit: Payer: Self-pay | Admitting: Obstetrics and Gynecology

## 2018-05-21 ENCOUNTER — Ambulatory Visit (HOSPITAL_COMMUNITY)
Admission: RE | Admit: 2018-05-21 | Discharge: 2018-05-21 | Disposition: A | Discharge status: Home / Self Care | Payer: Medicaid Other | Source: Ambulatory Visit | Attending: Obstetrics and Gynecology | Admitting: Obstetrics and Gynecology

## 2018-05-21 DIAGNOSIS — O09522 Supervision of elderly multigravida, second trimester: Secondary | ICD-10-CM

## 2018-05-21 DIAGNOSIS — Z3689 Encounter for other specified antenatal screening: Secondary | ICD-10-CM | POA: Insufficient documentation

## 2018-05-21 DIAGNOSIS — Z3480 Encounter for supervision of other normal pregnancy, unspecified trimester: Secondary | ICD-10-CM

## 2018-05-21 DIAGNOSIS — Z363 Encounter for antenatal screening for malformations: Secondary | ICD-10-CM | POA: Diagnosis not present

## 2018-05-21 DIAGNOSIS — Z3A18 18 weeks gestation of pregnancy: Secondary | ICD-10-CM | POA: Diagnosis not present

## 2018-05-24 ENCOUNTER — Encounter: Payer: Self-pay | Admitting: Obstetrics and Gynecology

## 2018-05-24 ENCOUNTER — Other Ambulatory Visit (HOSPITAL_COMMUNITY): Payer: Self-pay | Admitting: *Deleted

## 2018-05-24 ENCOUNTER — Encounter: Payer: Self-pay | Admitting: *Deleted

## 2018-05-24 DIAGNOSIS — O09522 Supervision of elderly multigravida, second trimester: Secondary | ICD-10-CM

## 2018-05-24 DIAGNOSIS — D563 Thalassemia minor: Secondary | ICD-10-CM | POA: Insufficient documentation

## 2018-05-26 ENCOUNTER — Telehealth: Payer: Self-pay | Admitting: *Deleted

## 2018-05-26 NOTE — Telephone Encounter (Addendum)
-----   Message from Lezlie Lye, NP sent at 05/24/2018  3:22 PM EDT ----- This patient is +thalasemia trait. Can we get her into genetic counselor please?  Thank you,   Brenda Tate   10/30  1154  Called pt with [facility] interpreter [phone]. Pt's husband answered and stated that pt is at home - there is no phone @ home. I explained the reason for my call. I offered appt w/Genetic Counselor within the next week or coordinate with next [place] on 11/22 but the appt time would change to the morning. He stated that they would like to have appts together on 11/22. Appts scheduled on 11/22 @ 0900 and he voiced understanding.

## 2018-06-07 ENCOUNTER — Ambulatory Visit (INDEPENDENT_AMBULATORY_CARE_PROVIDER_SITE_OTHER): Payer: Medicaid Other | Admitting: Family Medicine

## 2018-06-07 ENCOUNTER — Other Ambulatory Visit (HOSPITAL_COMMUNITY)
Admission: RE | Admit: 2018-06-07 | Discharge: 2018-06-07 | Disposition: A | Discharge status: Home / Self Care | Payer: Medicaid Other | Source: Ambulatory Visit | Attending: Obstetrics & Gynecology | Admitting: Obstetrics & Gynecology

## 2018-06-07 ENCOUNTER — Encounter: Payer: Self-pay | Admitting: Family Medicine

## 2018-06-07 VITALS — BP 102/61 | HR 85 | Wt 184.0 lb

## 2018-06-07 DIAGNOSIS — N898 Other specified noninflammatory disorders of vagina: Secondary | ICD-10-CM | POA: Insufficient documentation

## 2018-06-07 DIAGNOSIS — D563 Thalassemia minor: Secondary | ICD-10-CM

## 2018-06-07 DIAGNOSIS — O09522 Supervision of elderly multigravida, second trimester: Secondary | ICD-10-CM | POA: Diagnosis not present

## 2018-06-07 DIAGNOSIS — Z3480 Encounter for supervision of other normal pregnancy, unspecified trimester: Secondary | ICD-10-CM

## 2018-06-07 DIAGNOSIS — Z3A21 21 weeks gestation of pregnancy: Secondary | ICD-10-CM

## 2018-06-07 DIAGNOSIS — Z3402 Encounter for supervision of normal first pregnancy, second trimester: Secondary | ICD-10-CM

## 2018-06-07 MED ORDER — TERCONAZOLE 0.8 % VA CREA: 1.0000 | TOPICAL_CREAM | Freq: Every day | VAGINAL | 0 refills | Status: DC

## 2018-06-07 MED ORDER — COMFORT FIT MATERNITY SUPP LG MISC: 1.0000 [IU] | 0 refills | Status: DC | PRN

## 2018-06-07 NOTE — Patient Instructions (Addendum)

## 2018-06-07 NOTE — Progress Notes (Signed)
Patient reports vaginal itching & thick discharge since the beginning of pregnancy

## 2018-06-07 NOTE — Progress Notes (Signed)
    PRENATAL VISIT NOTE  Subjective:  Brenda Tate is a 37 y.o. G3P2002 at [redacted]w[redacted]d being seen today for ongoing prenatal care.  She is currently monitored for the following issues for this low-risk pregnancy and has Dyspareunia, female; Myopia of both eyes; Constipation; Advanced maternal age in multigravida, second trimester; Supervision of other normal pregnancy, antepartum; and Alpha thalassemia trait on their problem list.  Patient reports vaginal irritation.  Contractions: Not present. Vag. Bleeding: None.  Movement: Present. Denies leaking of fluid.   The following portions of the patient's history were reviewed and updated as appropriate: allergies, current medications, past family history, past medical history, past social history, past surgical history and problem list. Problem list updated.  Objective:   Vitals:   06/07/18 1509  BP: 102/61  Pulse: 85  Weight: 184 lb (83.5 kg)    Fetal Status: Fetal Heart Rate (bpm): 138 Fundal Height: 19 cm Movement: Present     General:  Alert, oriented and cooperative. Patient is in no acute distress.  Skin: Skin is warm and dry. No rash noted.   Cardiovascular: Normal heart rate noted  Respiratory: Normal respiratory effort, no problems with respiration noted  Abdomen: Soft, gravid, appropriate for gestational age.  Pain/Pressure: Present     Pelvic: Cervical exam deferred        Extremities: Normal range of motion.  Edema: None  Mental Status: Normal mood and affect. Normal behavior. Normal judgment and thought content.   Assessment and Plan:  Pregnancy: G3P2002 at [redacted]w[redacted]d  1. Encounter for supervision of normal pregnancy in second trimester Continue routine prenatal care. Normal anatomy and labs Maternity support belt - Elastic Bandages & Supports (COMFORT FIT MATERNITY SUPP LG) MISC; 1 Units by Does not apply route as needed.  Dispense: 1 each; Refill: 0  2. Advanced maternal age in multigravida, second trimester Normal  NIPT  3. Vaginal discharge Check cultures - Cervicovaginal ancillary only( [place]) - terconazole (TERAZOL 3) 0.8 % vaginal cream; Place 1 applicator vaginally at bedtime.  Dispense: 20 g; Refill: 0  5. Alpha thalassemia trait   6. Vaginal itching - terconazole (TERAZOL 3) 0.8 % vaginal cream; Place 1 applicator vaginally at bedtime.  Dispense: 20 g; Refill: 0  general obstetric precautions including but not limited to vaginal bleeding, contractions, leaking of fluid and fetal movement were reviewed in detail with the patient. Please refer to After Visit Summary for other counseling recommendations.  Return in 4 weeks (on 07/05/2018).  Future Appointments  Date Time Provider [place]  06/18/2018  9:00 AM WH-MFC [place] 3 WH-MFCUS MFC-US  06/18/2018 10:00 AM [place] GENETIC COUNSELING [place] MFC-US  07/05/2018  2:35 PM Emily Filbert, MD WOC-WOCA WOC    Donnamae Jude, MD

## 2018-06-08 LAB — CERVICOVAGINAL ANCILLARY ONLY
BACTERIAL VAGINITIS: NEGATIVE
Candida vaginitis: POSITIVE — AB
Chlamydia: NEGATIVE
Neisseria Gonorrhea: NEGATIVE
Trichomonas: NEGATIVE

## 2018-06-18 ENCOUNTER — Ambulatory Visit (HOSPITAL_COMMUNITY): Payer: Self-pay

## 2018-06-18 ENCOUNTER — Ambulatory Visit (HOSPITAL_COMMUNITY)
Admission: RE | Admit: 2018-06-18 | Discharge: 2018-06-18 | Disposition: A | Discharge status: Home / Self Care | Payer: Medicaid Other | Source: Ambulatory Visit | Attending: Maternal & Fetal Medicine | Admitting: Maternal & Fetal Medicine

## 2018-06-18 ENCOUNTER — Encounter (HOSPITAL_COMMUNITY): Payer: Self-pay

## 2018-06-18 ENCOUNTER — Ambulatory Visit (HOSPITAL_COMMUNITY): Payer: Medicaid Other

## 2018-06-18 ENCOUNTER — Other Ambulatory Visit (HOSPITAL_COMMUNITY): Payer: Self-pay | Admitting: *Deleted

## 2018-06-18 DIAGNOSIS — Z3A22 22 weeks gestation of pregnancy: Secondary | ICD-10-CM | POA: Insufficient documentation

## 2018-06-18 DIAGNOSIS — Z362 Encounter for other antenatal screening follow-up: Secondary | ICD-10-CM | POA: Diagnosis not present

## 2018-06-18 DIAGNOSIS — O09529 Supervision of elderly multigravida, unspecified trimester: Secondary | ICD-10-CM

## 2018-06-18 DIAGNOSIS — O09522 Supervision of elderly multigravida, second trimester: Secondary | ICD-10-CM

## 2018-06-18 NOTE — ED Notes (Signed)
Interpreter [phone] utilized during visit.

## 2018-06-21 DIAGNOSIS — H1013 Acute atopic conjunctivitis, bilateral: Secondary | ICD-10-CM | POA: Diagnosis not present

## 2018-07-05 ENCOUNTER — Ambulatory Visit (INDEPENDENT_AMBULATORY_CARE_PROVIDER_SITE_OTHER): Payer: Medicaid Other | Admitting: Obstetrics & Gynecology

## 2018-07-05 VITALS — BP 117/71 | HR 94 | Wt 189.3 lb

## 2018-07-05 DIAGNOSIS — D563 Thalassemia minor: Secondary | ICD-10-CM

## 2018-07-05 DIAGNOSIS — Z3480 Encounter for supervision of other normal pregnancy, unspecified trimester: Secondary | ICD-10-CM

## 2018-07-05 DIAGNOSIS — O09522 Supervision of elderly multigravida, second trimester: Secondary | ICD-10-CM

## 2018-07-05 DIAGNOSIS — Z3482 Encounter for supervision of other normal pregnancy, second trimester: Secondary | ICD-10-CM

## 2018-07-05 NOTE — Progress Notes (Signed)
   PRENATAL VISIT NOTE  Subjective:  Brenda Tate is a 37 y.o. G3P2002 at [redacted]w[redacted]d being seen today for ongoing prenatal care.  She is currently monitored for the following issues for this high-risk pregnancy and has Dyspareunia, female; Myopia of both eyes; Constipation; Advanced maternal age in multigravida, second trimester; Supervision of other normal pregnancy, antepartum; and Alpha thalassemia trait on their problem list.  Patient reports no complaints.  Contractions: Not present. Vag. Bleeding: None.  Movement: Present. Denies leaking of fluid.   The following portions of the patient's history were reviewed and updated as appropriate: allergies, current medications, past family history, past medical history, past social history, past surgical history and problem list. Problem list updated.  Objective:   Vitals:   07/05/18 1451  BP: 117/71  Pulse: 94  Weight: 189 lb 4.8 oz (85.9 kg)    Fetal Status: Fetal Heart Rate (bpm): 147   Movement: Present     General:  Alert, oriented and cooperative. Patient is in no acute distress.  Skin: Skin is warm and dry. No rash noted.   Cardiovascular: Normal heart rate noted  Respiratory: Normal respiratory effort, no problems with respiration noted  Abdomen: Soft, gravid, appropriate for gestational age.  Pain/Pressure: Absent     Pelvic: Cervical exam deferred        Extremities: Normal range of motion.  Edema: None  Mental Status: Normal mood and affect. Normal behavior. Normal judgment and thought content.   Assessment and Plan:  Pregnancy: G3P2002 at [redacted]w[redacted]d  1. Supervision of other normal pregnancy, antepartum - 2 hour GTT at next visit  2. Alpha thalassemia trait - she reports that her husband is not interested in getting tested  3. Advanced maternal age in multigravida, second trimester - normal NIPS  Preterm labor symptoms and general obstetric precautions including but not limited to vaginal bleeding, contractions, leaking  of fluid and fetal movement were reviewed in detail with the patient. Please refer to After Visit Summary for other counseling recommendations.  Return in about 3 weeks (around 07/26/2018) for 2 hour GTT.  Future Appointments  Date Time Provider [place]  07/16/2018  1:30 PM WH-MFC [place] 5 WH-MFCUS MFC-US    Emily Filbert, MD

## 2018-07-16 ENCOUNTER — Encounter (HOSPITAL_COMMUNITY): Payer: Self-pay

## 2018-07-16 ENCOUNTER — Ambulatory Visit (HOSPITAL_COMMUNITY)
Admission: RE | Admit: 2018-07-16 | Discharge: 2018-07-16 | Disposition: A | Discharge status: Home / Self Care | Payer: Medicaid Other | Source: Ambulatory Visit | Attending: Obstetrics and Gynecology | Admitting: Obstetrics and Gynecology

## 2018-07-16 ENCOUNTER — Other Ambulatory Visit (HOSPITAL_COMMUNITY): Payer: Self-pay | Admitting: *Deleted

## 2018-07-16 DIAGNOSIS — O09522 Supervision of elderly multigravida, second trimester: Secondary | ICD-10-CM | POA: Diagnosis not present

## 2018-07-16 DIAGNOSIS — Z3A26 26 weeks gestation of pregnancy: Secondary | ICD-10-CM | POA: Insufficient documentation

## 2018-07-16 DIAGNOSIS — O402XX0 Polyhydramnios, second trimester, not applicable or unspecified: Secondary | ICD-10-CM | POA: Diagnosis not present

## 2018-07-16 DIAGNOSIS — O359XX0 Maternal care for (suspected) fetal abnormality and damage, unspecified, not applicable or unspecified: Secondary | ICD-10-CM

## 2018-07-16 DIAGNOSIS — O09529 Supervision of elderly multigravida, unspecified trimester: Secondary | ICD-10-CM

## 2018-07-16 DIAGNOSIS — Z362 Encounter for other antenatal screening follow-up: Secondary | ICD-10-CM

## 2018-07-26 ENCOUNTER — Other Ambulatory Visit: Payer: Self-pay | Admitting: *Deleted

## 2018-07-26 DIAGNOSIS — Z3480 Encounter for supervision of other normal pregnancy, unspecified trimester: Secondary | ICD-10-CM

## 2018-07-27 ENCOUNTER — Other Ambulatory Visit: Payer: Medicaid Other

## 2018-07-27 ENCOUNTER — Encounter: Payer: Self-pay | Admitting: Obstetrics & Gynecology

## 2018-07-27 ENCOUNTER — Ambulatory Visit (INDEPENDENT_AMBULATORY_CARE_PROVIDER_SITE_OTHER): Payer: Medicaid Other | Admitting: Obstetrics & Gynecology

## 2018-07-27 VITALS — BP 98/58 | HR 83

## 2018-07-27 DIAGNOSIS — Z23 Encounter for immunization: Secondary | ICD-10-CM

## 2018-07-27 DIAGNOSIS — Z3480 Encounter for supervision of other normal pregnancy, unspecified trimester: Secondary | ICD-10-CM

## 2018-07-27 DIAGNOSIS — D563 Thalassemia minor: Secondary | ICD-10-CM

## 2018-07-27 DIAGNOSIS — O09522 Supervision of elderly multigravida, second trimester: Secondary | ICD-10-CM

## 2018-07-27 DIAGNOSIS — O409XX0 Polyhydramnios, unspecified trimester, not applicable or unspecified: Secondary | ICD-10-CM | POA: Insufficient documentation

## 2018-07-27 NOTE — Progress Notes (Signed)
   PRENATAL VISIT NOTE  Subjective:  Brenda Tate is a 37 y.o. G3P2002 at [redacted]w[redacted]d being seen today for ongoing prenatal care.  She is currently monitored for the following issues for this low-risk pregnancy and has Dyspareunia, female; Myopia of both eyes; Constipation; Advanced maternal age in multigravida, second trimester; Supervision of other normal pregnancy, antepartum; and Alpha thalassemia trait on their problem list.  Patient reports no complaints.  Contractions: Not present. Vag. Bleeding: None.  Movement: Present. Denies leaking of fluid.   The following portions of the patient's history were reviewed and updated as appropriate: allergies, current medications, past family history, past medical history, past social history, past surgical history and problem list. Problem list updated.  Objective:   Vitals:   07/27/18 0914  BP: (!) 98/58  Pulse: 83    Fetal Status: Fetal Heart Rate (bpm): 141   Movement: Present     General:  Alert, oriented and cooperative. Patient is in no acute distress.  Skin: Skin is warm and dry. No rash noted.   Cardiovascular: Normal heart rate noted  Respiratory: Normal respiratory effort, no problems with respiration noted  Abdomen: Soft, gravid, appropriate for gestational age.  Pain/Pressure: Absent     Pelvic: Cervical exam deferred        Extremities: Normal range of motion.  Edema: None  Mental Status: Normal mood and affect. Normal behavior. Normal judgment and thought content.   Assessment and Plan:  Pregnancy: G3P2002 at [redacted]w[redacted]d  1. Alpha thalassemia trait   2. Advanced maternal age in multigravida, second trimester - low risk NIPS, normal MSAFP  3. Supervision of other normal pregnancy, antepartum -tdap -28 week labs  Preterm labor symptoms and general obstetric precautions including but not limited to vaginal bleeding, contractions, leaking of fluid and fetal movement were reviewed in detail with the patient. Please refer to  After Visit Summary for other counseling recommendations.  No follow-ups on file.  Future Appointments  Date Time Provider [place]  07/27/2018 10:00 AM WOC-WOCA LAB WOC-WOCA WOC  08/13/2018  3:30 PM WH-MFC [place] 1 WH-MFCUS MFC-US    Emily Filbert, MD

## 2018-07-27 NOTE — Progress Notes (Signed)
Video Interpreter # [phone]

## 2018-07-27 NOTE — Addendum Note (Signed)
Addended by: Michel Harrow on: 07/27/2018 10:27 AM   Modules accepted: Orders

## 2018-07-28 LAB — CBC
Hematocrit: 33.2 % — ABNORMAL LOW (ref 34.0–46.6)
Hemoglobin: 11.5 g/dL (ref 11.1–15.9)
MCH: 27.6 pg (ref 26.6–33.0)
MCHC: 34.6 g/dL (ref 31.5–35.7)
MCV: 80 fL (ref 79–97)
Platelets: 127 10*3/uL — ABNORMAL LOW (ref 150–450)
RBC: 4.17 x10E6/uL (ref 3.77–5.28)
RDW: 12.9 % (ref 12.3–15.4)
WBC: 7 10*3/uL (ref 3.4–10.8)

## 2018-07-28 LAB — GLUCOSE TOLERANCE, 2 HOURS W/ 1HR
GLUCOSE, 1 HOUR: 170 mg/dL (ref 65–179)
GLUCOSE, FASTING: 83 mg/dL (ref 65–91)
Glucose, 2 hour: 141 mg/dL (ref 65–152)

## 2018-07-28 LAB — RPR: RPR Ser Ql: NONREACTIVE

## 2018-07-28 LAB — HIV ANTIBODY (ROUTINE TESTING W REFLEX): HIV Screen 4th Generation wRfx: NONREACTIVE

## 2018-07-28 NOTE — L&D Delivery Note (Signed)
Brenda Tate is a 38 y.o. female G3P2002 with IUP at [redacted]w[redacted]d admitted for IOL for AMA at term.  She progressed with cytotec, foley bulb and pitocin augmentation to complete and pushed 3 times to deliver.  Cord clamping delayed by several minutes then clamped by CNM and cut by CNM.    Delivery Note At 11:14 PM a viable female was delivered via Vaginal, Spontaneous (Presentation: LOA; with compound hand).  APGAR: , ; weight pending.   Placenta status: spontaneous, intact.  Cord: 3 vessels with the following complications: tight nuchal x1, unable to reduce, delivered through with somersault maneuver and reduced after.  Anesthesia:  epidural Episiotomy:  n/a Lacerations:  1st degree perineal Suture Repair: 3.0 vicryl Est. Blood Loss (mL):  411  Mom to postpartum.  Baby to Couplet care / Skin to Skin.  Wende Mott CNM 10/17/2018, 11:26 PM

## 2018-08-13 ENCOUNTER — Ambulatory Visit (HOSPITAL_COMMUNITY)
Admission: RE | Admit: 2018-08-13 | Discharge: 2018-08-13 | Disposition: A | Discharge status: Home / Self Care | Payer: Medicaid Other | Source: Ambulatory Visit | Attending: Student | Admitting: Student

## 2018-08-13 ENCOUNTER — Encounter (HOSPITAL_COMMUNITY): Payer: Self-pay

## 2018-08-13 DIAGNOSIS — Z3480 Encounter for supervision of other normal pregnancy, unspecified trimester: Secondary | ICD-10-CM | POA: Diagnosis not present

## 2018-08-13 DIAGNOSIS — O09529 Supervision of elderly multigravida, unspecified trimester: Secondary | ICD-10-CM

## 2018-08-13 DIAGNOSIS — Z362 Encounter for other antenatal screening follow-up: Secondary | ICD-10-CM | POA: Diagnosis not present

## 2018-08-13 DIAGNOSIS — Z3A30 30 weeks gestation of pregnancy: Secondary | ICD-10-CM

## 2018-08-13 DIAGNOSIS — O09522 Supervision of elderly multigravida, second trimester: Secondary | ICD-10-CM | POA: Insufficient documentation

## 2018-08-13 DIAGNOSIS — O09523 Supervision of elderly multigravida, third trimester: Secondary | ICD-10-CM | POA: Diagnosis not present

## 2018-08-13 DIAGNOSIS — D563 Thalassemia minor: Secondary | ICD-10-CM

## 2018-08-13 DIAGNOSIS — O359XX0 Maternal care for (suspected) fetal abnormality and damage, unspecified, not applicable or unspecified: Secondary | ICD-10-CM

## 2018-08-16 ENCOUNTER — Other Ambulatory Visit (HOSPITAL_COMMUNITY): Payer: Self-pay | Admitting: *Deleted

## 2018-08-16 DIAGNOSIS — O403XX0 Polyhydramnios, third trimester, not applicable or unspecified: Secondary | ICD-10-CM

## 2018-08-24 ENCOUNTER — Ambulatory Visit (INDEPENDENT_AMBULATORY_CARE_PROVIDER_SITE_OTHER): Payer: Medicaid Other | Admitting: Student

## 2018-08-24 VITALS — BP 94/64 | HR 93 | Wt 196.4 lb

## 2018-08-24 DIAGNOSIS — Z3480 Encounter for supervision of other normal pregnancy, unspecified trimester: Secondary | ICD-10-CM

## 2018-08-24 DIAGNOSIS — O403XX0 Polyhydramnios, third trimester, not applicable or unspecified: Secondary | ICD-10-CM

## 2018-08-24 NOTE — Patient Instructions (Addendum)
Etonogestrel implant What is this medicine? ETONOGESTREL (et oh noe JES trel) is a contraceptive (birth control) device. It is used to prevent pregnancy. It can be used for up to 3 years. This medicine may be used for other purposes; ask your health care provider or pharmacist if you have questions. COMMON BRAND NAME(S): Implanon, Nexplanon What should I tell my health care provider before I take this medicine? They need to know if you have any of these conditions: -abnormal vaginal bleeding -blood vessel disease or blood clots -breast, cervical, endometrial, ovarian, liver, or uterine cancer -diabetes -gallbladder disease -heart disease or recent heart attack -high blood pressure -high cholesterol or triglycerides -kidney disease -liver disease -migraine headaches -seizures -stroke -tobacco smoker -an unusual or allergic reaction to etonogestrel, anesthetics or antiseptics, other medicines, foods, dyes, or preservatives -pregnant or trying to get pregnant -breast-feeding How should I use this medicine? This device is inserted just under the skin on the inner side of your upper arm by a health care professional. Talk to your pediatrician regarding the use of this medicine in children. Special care may be needed. Overdosage: If you think you have taken too much of this medicine contact a poison control center or emergency room at once. NOTE: This medicine is only for you. Do not share this medicine with others. What if I miss a dose? This does not apply. What may interact with this medicine? Do not take this medicine with any of the following medications: -amprenavir -fosamprenavir This medicine may also interact with the following medications: -acitretin -aprepitant -armodafinil -bexarotene -bosentan -carbamazepine -certain medicines for fungal infections like fluconazole, ketoconazole, itraconazole and voriconazole -certain medicines to treat hepatitis, HIV or  AIDS -cyclosporine -felbamate -griseofulvin -lamotrigine -modafinil -oxcarbazepine -phenobarbital -phenytoin -primidone -rifabutin -rifampin -rifapentine -St. John's wort -topiramate This list may not describe all possible interactions. Give your health care provider a list of all the medicines, herbs, non-prescription drugs, or dietary supplements you use. Also tell them if you smoke, drink alcohol, or use illegal drugs. Some items may interact with your medicine. What should I watch for while using this medicine? This product does not protect you against HIV infection (AIDS) or other sexually transmitted diseases. You should be able to feel the implant by pressing your fingertips over the skin where it was inserted. Contact your doctor if you cannot feel the implant, and use a non-hormonal birth control method (such as condoms) until your doctor confirms that the implant is in place. Contact your doctor if you think that the implant may have broken or become bent while in your arm. You will receive a user card from your health care provider after the implant is inserted. The card is a record of the location of the implant in your upper arm and when it should be removed. Keep this card with your health records. What side effects may I notice from receiving this medicine? Side effects that you should report to your doctor or health care professional as soon as possible: -allergic reactions like skin rash, itching or hives, swelling of the face, lips, or tongue -breast lumps, breast tissue changes, or discharge -breathing problems -changes in emotions or moods -if you feel that the implant may have broken or bent while in your arm -high blood pressure -pain, irritation, swelling, or bruising at the insertion site -scar at site of insertion -signs of infection at the insertion site such as fever, and skin redness, pain or discharge -signs and symptoms of a blood clot such  as breathing  problems; changes in vision; chest pain; severe, sudden headache; pain, swelling, warmth in the leg; trouble speaking; sudden numbness or weakness of the face, arm or leg -signs and symptoms of liver injury like dark yellow or brown urine; general ill feeling or flu-like symptoms; light-colored stools; loss of appetite; nausea; right upper belly pain; unusually weak or tired; yellowing of the eyes or skin -unusual vaginal bleeding, discharge Side effects that usually do not require medical attention (report to your doctor or health care professional if they continue or are bothersome): -acne -breast pain or tenderness -headache -irregular menstrual bleeding -nausea This list may not describe all possible side effects. Call your doctor for medical advice about side effects. You may report side effects to FDA at 1-800-FDA-1088. Where should I keep my medicine? This drug is given in a hospital or clinic and will not be stored at home. NOTE: This sheet is a summary. It may not cover all possible information. If you have questions about this medicine, talk to your doctor, pharmacist, or health care provider.  2019 Elsevier/Gold Standard (2017-06-02 14:11:42) CIRCUMCISION  Circumcision is considered an elective/non-medically necessary procedure. There are many reasons parents decide to have their sons circumsized. During the first year of life circumcised males have a reduced risk of urinary tract infections but after this year the rates between circumcised males and uncircumcised males are the same.  It is safe to have your son circumcised outside of the hospital and the places above perform them regularly.    Places to have your son circumcised:    [facility] [phone] $480 by 4 wks  [facility] [phone] $244 by 4 wks  [facility] [phone] $175 by 2 wks   [facility] [phone] $250 by 7 days [facility] [phone] $150 by 4 wks  These prices sometimes change but are roughly what you can expect to pay. Please call and confirm pricing.

## 2018-08-24 NOTE — Progress Notes (Signed)
   PRENATAL VISIT NOTE  Subjective:  Brenda Tate is a 38 y.o. G3P2002 at [redacted]w[redacted]d being seen today for ongoing prenatal care.  She is currently monitored for the following issues for this low-risk pregnancy and has Dyspareunia, female; Myopia of both eyes; Constipation; Advanced maternal age in multigravida, second trimester; Supervision of other normal pregnancy, antepartum; Alpha thalassemia trait; and Polyhydramnios on their problem list.  Patient reports no complaints.  Contractions: Not present. Vag. Bleeding: None.  Movement: Present. Denies leaking of fluid.   The following portions of the patient's history were reviewed and updated as appropriate: allergies, current medications, past family history, past medical history, past social history, past surgical history and problem list. Problem list updated.  Objective:   Vitals:   08/24/18 0927  BP: 94/64  Pulse: 93  Weight: 196 lb 6.4 oz (89.1 kg)    Fetal Status: Fetal Heart Rate (bpm): 145 Fundal Height: 32 cm Movement: Present     General:  Alert, oriented and cooperative. Patient is in no acute distress.  Skin: Skin is warm and dry. No rash noted.   Cardiovascular: Normal heart rate noted  Respiratory: Normal respiratory effort, no problems with respiration noted  Abdomen: Soft, gravid, appropriate for gestational age.  Pain/Pressure: Absent     Pelvic: Cervical exam deferred        Extremities: Normal range of motion.  Edema: None  Mental Status: Normal mood and affect. Normal behavior. Normal judgment and thought content.   Assessment and Plan:  Pregnancy: G3P2002 at [redacted]w[redacted]d  1. Supervision of other normal pregnancy, antepartum -discussed nexplanon, diagnosis of possibly poly, and circumcision.  Information given on nexplanon and circ.   2. Polyhydramnios in third trimester complication, single or unspecified fetus -reviewed [place] and GTT; not diabetic; explained that she needs follow up US and that at this point there  is no intervention needed.  -reassured her that renal pyelactasis is resolved.   Preterm labor symptoms and general obstetric precautions including but not limited to vaginal bleeding, contractions, leaking of fluid and fetal movement were reviewed in detail with the patient. Please refer to After Visit Summary for other counseling recommendations.  Return in about 2 weeks (around 09/07/2018), or LROB.  Future Appointments  Date Time Provider [place]  09/10/2018  2:30 PM WH-MFC [place] 5 WH-MFCUS MFC-US    Vance Belcourt Lorraine Delaynee Alred, [place]

## 2018-09-07 ENCOUNTER — Encounter: Payer: Self-pay | Admitting: Student

## 2018-09-07 ENCOUNTER — Encounter: Payer: Self-pay | Admitting: Family Medicine

## 2018-09-07 ENCOUNTER — Ambulatory Visit (INDEPENDENT_AMBULATORY_CARE_PROVIDER_SITE_OTHER): Payer: Medicaid Other | Admitting: Student

## 2018-09-07 DIAGNOSIS — Z3483 Encounter for supervision of other normal pregnancy, third trimester: Secondary | ICD-10-CM

## 2018-09-07 DIAGNOSIS — D563 Thalassemia minor: Secondary | ICD-10-CM

## 2018-09-07 DIAGNOSIS — Z3480 Encounter for supervision of other normal pregnancy, unspecified trimester: Secondary | ICD-10-CM

## 2018-09-07 NOTE — Patient Instructions (Signed)
Breastfeeding and Human Lactation (4th ed., pp. 262-299). [place], [place]: [employer].">   Breastfeeding and Mastitis    Mastitis is inflammation of the breast tissue. It can occur in women who are breastfeeding. This can make breastfeeding painful. Mastitis will sometimes go away on its own, especially if it is not caused by an infection (non-infectious mastitis). Your health care provider will help determine if medical treatment is needed. Treatment may be needed if the condition is caused by a bacterial infection (infectious mastitis).  What are the causes?  This condition is often associated with a blocked milkduct, which can happen when too much milk builds up in the breast. Causes of excess milk in the breast can include:  Poor latch-on. If your baby is not latched onto the breast properly, he or she may not empty your breast completely while breastfeeding.  Allowing too much time to pass between feedings.  Wearing a bra or other clothing that is too tight. This puts extra pressure on the milk ducts so milk does not flow through them as it should.  Milk remaining in the breast because it is overfilled (engorged).  Stress and fatigue.  Mastitis can also be caused by a bacterial infection. Bacteria may enter the breast tissue through cuts, cracks, or openings in the skin near the nipple area. Cracks in the skin are often caused when your baby does not latch on properly to the breast.  What are the signs or symptoms?  Symptoms of this condition include:  Swelling, redness, tenderness, and pain in an area of the breast. This usually affects the upper part of the breast, toward the armpit region. In most cases, it affects only one breast. In some cases, it may occur on both breasts at the same time and affect a larger portion of breast tissue.  Swelling of the glands under the arm on the same side.  Fatigue, headache, and flu-like muscle aches.  Fever.  Rapid pulse.  Symptoms usually last 2 to 5  days. Breast pain and redness are at their worst on day 2 and day 3, and they usually go away by day 5. If an infection is left to progress, a collection of pus (abscess) may develop.  How is this diagnosed?  This condition can be diagnosed based on your symptoms and a physical exam. You may also have tests, such as:  Blood tests to determine if your body is fighting a bacterial infection.  Mammogram or ultrasound tests to rule out other problems or diseases.  Fluid tests. If an abscess has developed, the fluid in the abscess may be removed with a needle. The fluid may be analyzed to determine if bacteria are present.  Breast milk may be cultured and tested for bacteria.  How is this treated?  This condition will sometimes go away on its own. Your health care provider may choose to wait 24 hours after first seeing you to decide whether treatment is needed. If treatment is needed, it may include:  Strategies to manage breastfeeding. This includes continuing to breastfeed or pump in order to allow adequate milk flow, using breast massage, and applying heat or cold to the affected area.  Self-care such as rest and increased fluid intake.  Medicine for pain.  Antibiotic medicine to treat a bacterial infection. This is usually taken by mouth.  If an abscess has developed, it may be treated by removing fluid with a needle.  Follow these instructions at home:  Medicines  Take   over-the-counter and prescription medicines only as told by your health care provider.  If you were prescribed an antibiotic medicine, take it as told by your health care provider. Do not stop taking the antibiotic even if you start to feel better.  General instructions  Do not wear a tight or underwire bra. Wear a soft, supportive bra.  Increase your fluid intake, especially if you have a fever.  Get plenty of rest.  For breastfeeding:  Continue to empty your breasts as often as possible, either by breastfeeding or using an electric breast pump. This  will lower the pressure and the pain that comes with it. Ask your health care provider if changes need to be made to your breastfeeding or pumping routine.  Keep your nipples clean and dry.  During breastfeeding, empty the first breast completely before going to the other breast. If your baby is not emptying your breasts completely, use a breast pump to empty your breasts.  Use breast massage during feeding or pumping sessions.  If directed, apply moist heat to the affected area of your breast right before breastfeeding or pumping. Use the heat source that your health care provider recommends.  If directed, put ice on the affected area of your breast right after breastfeeding or pumping:  Put ice in a plastic bag.  Place a towel between your skin and the bag.  Leave the ice on for 20 minutes.  If you go back to work, pump your breasts while at work to stay in time with your nursing schedule.  Do not allow your breasts to become engorged.  Contact a health care provider if:  You have pus-like discharge from the breast.  You have a fever.  Your symptoms do not improve within 2 days of starting treatment.  Your symptoms return after you have recovered from a breast infection.  Get help right away if:  Your pain and swelling are getting worse.  You have pain that is not controlled with medicine.  You have a red line extending from the breast toward your armpit.  Summary  Mastitis is inflammation of the breast tissue. It is often caused by a blocked milk duct or bacteria.  This condition may be treated with hot and cold compresses, medicines, self-care, and certain breastfeeding strategies.  If you were prescribed an antibiotic medicine, take it as told by your health care provider. Do not stop taking the antibiotic even if you start to feel better.  Continue to empty your breasts as often as possible either by breastfeeding or using an electric breast pump.  This information is not intended to replace advice given to  you by your health care provider. Make sure you discuss any questions you have with your health care provider.  Document Released: 11/08/2004 Document Revised: 04/02/2018 Document Reviewed: 07/15/2016  Elsevier Interactive Patient Education © 2019 Elsevier Inc.

## 2018-09-07 NOTE — Progress Notes (Signed)
   PRENATAL VISIT NOTE  Subjective:  Brenda Tate is a 38 y.o. G3P2002 at [redacted]w[redacted]d being seen today for ongoing prenatal care.  She is currently monitored for the following issues for this low-risk pregnancy and has Dyspareunia, female; Myopia of both eyes; Constipation; Advanced maternal age in multigravida, second trimester; Supervision of other normal pregnancy, antepartum; Alpha thalassemia trait; and Polyhydramnios on their problem list.  Patient reports no complaints.  Contractions: Not present. Vag. Bleeding: None.  Movement: Present. Denies leaking of fluid.   The following portions of the patient's history were reviewed and updated as appropriate: allergies, current medications, past family history, past medical history, past social history, past surgical history and problem list. Problem list updated.  Objective:   Vitals:   09/07/18 1548  BP: 105/68  Pulse: 94  Weight: 199 lb 9.6 oz (90.5 kg)    Fetal Status: Fetal Heart Rate (bpm): 153 Fundal Height: 35 cm Movement: Present     General:  Alert, oriented and cooperative. Patient is in no acute distress.  Skin: Skin is warm and dry. No rash noted.   Cardiovascular: Normal heart rate noted  Respiratory: Normal respiratory effort, no problems with respiration noted  Abdomen: Soft, gravid, appropriate for gestational age.  Pain/Pressure: Absent     Pelvic: Cervical exam deferred        Extremities: Normal range of motion.  Edema: None  Mental Status: Normal mood and affect. Normal behavior. Normal judgment and thought content.   Assessment and Plan:  Pregnancy: G3P2002 at [redacted]w[redacted]d  1. Alpha thalassemia trait Per patient on 2-11, patient's partner does not want to be tested.   2. Supervision of other normal pregnancy, antepartum -keep [place] appt on Friday, 2-14 to check fluid level -anticipatory guidance given for next visit (GBS, GC chlamydia)  Preterm labor symptoms and general obstetric precautions including but not  limited to vaginal bleeding, contractions, leaking of fluid and fetal movement were reviewed in detail with the patient. Please refer to After Visit Summary for other counseling recommendations.  Return in about 2 weeks (around 09/21/2018), or LROB.  Future Appointments  Date Time Provider [place]  09/10/2018  3:30 PM WH-MFC [place] 5 WH-MFCUS MFC-US    Kathryn Lorraine Kooistra, [place]

## 2018-09-10 ENCOUNTER — Ambulatory Visit (HOSPITAL_COMMUNITY)
Admission: RE | Admit: 2018-09-10 | Discharge: 2018-09-10 | Disposition: A | Discharge status: Home / Self Care | Payer: Medicaid Other | Source: Ambulatory Visit | Attending: Student | Admitting: Student

## 2018-09-10 ENCOUNTER — Encounter (HOSPITAL_COMMUNITY): Payer: Self-pay

## 2018-09-10 ENCOUNTER — Ambulatory Visit (HOSPITAL_COMMUNITY): Payer: Medicaid Other

## 2018-09-10 DIAGNOSIS — Z3480 Encounter for supervision of other normal pregnancy, unspecified trimester: Secondary | ICD-10-CM | POA: Insufficient documentation

## 2018-09-10 DIAGNOSIS — O09523 Supervision of elderly multigravida, third trimester: Secondary | ICD-10-CM | POA: Diagnosis not present

## 2018-09-10 DIAGNOSIS — Z362 Encounter for other antenatal screening follow-up: Secondary | ICD-10-CM

## 2018-09-10 DIAGNOSIS — O3660X3 Maternal care for excessive fetal growth, unspecified trimester, fetus 3: Secondary | ICD-10-CM | POA: Diagnosis not present

## 2018-09-10 DIAGNOSIS — Z3A34 34 weeks gestation of pregnancy: Secondary | ICD-10-CM | POA: Diagnosis not present

## 2018-09-10 DIAGNOSIS — O403XX0 Polyhydramnios, third trimester, not applicable or unspecified: Secondary | ICD-10-CM | POA: Diagnosis not present

## 2018-09-10 DIAGNOSIS — O09522 Supervision of elderly multigravida, second trimester: Secondary | ICD-10-CM | POA: Insufficient documentation

## 2018-09-10 DIAGNOSIS — D563 Thalassemia minor: Secondary | ICD-10-CM | POA: Diagnosis not present

## 2018-09-13 ENCOUNTER — Other Ambulatory Visit (HOSPITAL_COMMUNITY): Payer: Self-pay | Admitting: *Deleted

## 2018-09-13 DIAGNOSIS — O3663X0 Maternal care for excessive fetal growth, third trimester, not applicable or unspecified: Secondary | ICD-10-CM

## 2018-09-22 ENCOUNTER — Encounter: Payer: Self-pay | Admitting: Medical

## 2018-09-22 ENCOUNTER — Other Ambulatory Visit: Payer: Self-pay

## 2018-09-22 ENCOUNTER — Other Ambulatory Visit (HOSPITAL_COMMUNITY)
Admission: RE | Admit: 2018-09-22 | Discharge: 2018-09-22 | Disposition: A | Discharge status: Home / Self Care | Payer: Medicaid Other | Source: Ambulatory Visit | Attending: Medical | Admitting: Medical

## 2018-09-22 ENCOUNTER — Ambulatory Visit (INDEPENDENT_AMBULATORY_CARE_PROVIDER_SITE_OTHER): Payer: Medicaid Other | Admitting: Medical

## 2018-09-22 VITALS — BP 101/61 | HR 104 | Wt 198.8 lb

## 2018-09-22 DIAGNOSIS — Z3480 Encounter for supervision of other normal pregnancy, unspecified trimester: Secondary | ICD-10-CM

## 2018-09-22 DIAGNOSIS — Z3483 Encounter for supervision of other normal pregnancy, third trimester: Secondary | ICD-10-CM

## 2018-09-22 DIAGNOSIS — O09523 Supervision of elderly multigravida, third trimester: Secondary | ICD-10-CM

## 2018-09-22 DIAGNOSIS — O403XX0 Polyhydramnios, third trimester, not applicable or unspecified: Secondary | ICD-10-CM

## 2018-09-22 DIAGNOSIS — Z3A36 36 weeks gestation of pregnancy: Secondary | ICD-10-CM

## 2018-09-22 DIAGNOSIS — O09522 Supervision of elderly multigravida, second trimester: Secondary | ICD-10-CM

## 2018-09-22 DIAGNOSIS — D563 Thalassemia minor: Secondary | ICD-10-CM

## 2018-09-22 NOTE — Progress Notes (Signed)
   PRENATAL VISIT NOTE  Subjective:  Brenda Tate is a 38 y.o. G3P2002 at [redacted]w[redacted]d being seen today for ongoing prenatal care.  She is currently monitored for the following issues for this high-risk pregnancy and has Dyspareunia, female; Myopia of both eyes; Constipation; Advanced maternal age in multigravida, second trimester; Supervision of other normal pregnancy, antepartum; Alpha thalassemia trait; and Polyhydramnios on their problem list.  Patient reports no complaints.  Contractions: Not present. Vag. Bleeding: None.  Movement: Present. Denies leaking of fluid.   The following portions of the patient's history were reviewed and updated as appropriate: allergies, current medications, past family history, past medical history, past social history, past surgical history and problem list. Problem list updated.  Objective:   Vitals:   09/22/18 1336  BP: 101/61  Pulse: (!) 104  Weight: 198 lb 12.8 oz (90.2 kg)    Fetal Status: Fetal Heart Rate (bpm): 134 Fundal Height: 37 cm Movement: Present     General:  Alert, oriented and cooperative. Patient is in no acute distress.  Skin: Skin is warm and dry. No rash noted.   Cardiovascular: Normal heart rate noted  Respiratory: Normal respiratory effort, no problems with respiration noted  Abdomen: Soft, gravid, appropriate for gestational age.  Pain/Pressure: Absent     Pelvic: Cervical exam performed      1/50/-3  Extremities: Normal range of motion.  Edema: None  Mental Status: Normal mood and affect. Normal behavior. Normal judgment and thought content.   Assessment and Plan:  Pregnancy: G3P2002 at [redacted]w[redacted]d  1. Supervision of other normal pregnancy, antepartum - GC/Chlamydia probe amp ([place])not at [facility] - Culture, beta strep (group b only)  2. Polyhydramnios in third trimester complication, single or unspecified fetus - Follow-up US on 3/9  3. Advanced maternal age in multigravida, second trimester - Less than 69, no change  in management   4. Alpha thalassemia trait  Preterm labor symptoms and general obstetric precautions including but not limited to vaginal bleeding, contractions, leaking of fluid and fetal movement were reviewed in detail with the patient. Please refer to After Visit Summary for other counseling recommendations.  Return in about 1 week (around 09/29/2018) for [facility].  Future Appointments  Date Time Provider [place]  10/04/2018  8:30 AM WH-MFC [place] 1 WH-MFCUS MFC-US    Kerry Hough, PA-C

## 2018-09-22 NOTE — Patient Instructions (Signed)

## 2018-09-23 LAB — GC/CHLAMYDIA PROBE AMP (~~LOC~~) NOT AT ARMC
Chlamydia: NEGATIVE
NEISSERIA GONORRHEA: NEGATIVE

## 2018-09-26 LAB — CULTURE, BETA STREP (GROUP B ONLY): Strep Gp B Culture: NEGATIVE

## 2018-09-30 ENCOUNTER — Ambulatory Visit (INDEPENDENT_AMBULATORY_CARE_PROVIDER_SITE_OTHER): Payer: Medicaid Other | Admitting: Obstetrics and Gynecology

## 2018-09-30 ENCOUNTER — Encounter: Payer: Self-pay | Admitting: Obstetrics and Gynecology

## 2018-09-30 VITALS — BP 105/65 | HR 102 | Wt 199.0 lb

## 2018-09-30 DIAGNOSIS — Z3480 Encounter for supervision of other normal pregnancy, unspecified trimester: Secondary | ICD-10-CM

## 2018-09-30 DIAGNOSIS — O401XX0 Polyhydramnios, first trimester, not applicable or unspecified: Secondary | ICD-10-CM

## 2018-09-30 DIAGNOSIS — Z789 Other specified health status: Secondary | ICD-10-CM | POA: Insufficient documentation

## 2018-09-30 DIAGNOSIS — Z3A37 37 weeks gestation of pregnancy: Secondary | ICD-10-CM

## 2018-09-30 DIAGNOSIS — O09523 Supervision of elderly multigravida, third trimester: Secondary | ICD-10-CM

## 2018-09-30 DIAGNOSIS — O09522 Supervision of elderly multigravida, second trimester: Secondary | ICD-10-CM

## 2018-09-30 DIAGNOSIS — O403XX0 Polyhydramnios, third trimester, not applicable or unspecified: Secondary | ICD-10-CM

## 2018-09-30 NOTE — Progress Notes (Signed)
   PRENATAL VISIT NOTE  Subjective:  Brenda Tate is a 38 y.o. G3P2002 at [redacted]w[redacted]d being seen today for ongoing prenatal care.  She is currently monitored for the following issues for this high-risk pregnancy and has Dyspareunia, female; Myopia of both eyes; Constipation; Advanced maternal age in multigravida, second trimester; Supervision of other normal pregnancy, antepartum; Alpha thalassemia trait; Polyhydramnios; and Language barrier on their problem list.  Patient reports no complaints.  Contractions: Not present. Vag. Bleeding: None.  Movement: Present. Denies leaking of fluid.   The following portions of the patient's history were reviewed and updated as appropriate: allergies, current medications, past family history, past medical history, past social history, past surgical history and problem list. Problem list updated.  Objective:   Vitals:   09/30/18 1345  BP: 105/65  Pulse: (!) 102  Weight: 199 lb (90.3 kg)    Fetal Status: Fetal Heart Rate (bpm): 140   Movement: Present     General:  Alert, oriented and cooperative. Patient is in no acute distress.  Skin: Skin is warm and dry. No rash noted.   Cardiovascular: Normal heart rate noted  Respiratory: Normal respiratory effort, no problems with respiration noted  Abdomen: Soft, gravid, appropriate for gestational age.  Pain/Pressure: Absent     Pelvic: Cervical exam deferred        Extremities: Normal range of motion.  Edema: None  Mental Status: Normal mood and affect. Normal behavior. Normal judgment and thought content.   Assessment and Plan:  Pregnancy: G3P2002 at [redacted]w[redacted]d  1. Supervision of other normal pregnancy, antepartum Last growth > 90th%tile, next growth 10/04/18 Largest baby 4.5 kg  2. Polyhydramnios in first trimester, single or unspecified fetus Last AFI 19 cm   3. Advanced maternal age in multigravida, second trimester  4. Language barrier Fish farm manager used   Term labor symptoms and general  obstetric precautions including but not limited to vaginal bleeding, contractions, leaking of fluid and fetal movement were reviewed in detail with the patient. Please refer to After Visit Summary for other counseling recommendations.  Return in about 1 week (around 10/07/2018) for OB visit (MD).  Future Appointments  Date Time Provider [place]  10/04/2018  8:10 AM [place] NURSE Trainer [facility]  10/04/2018  8:30 AM [place] 1 [facility]    Sloan Leiter, MD

## 2018-10-04 ENCOUNTER — Ambulatory Visit (HOSPITAL_COMMUNITY)
Admission: RE | Admit: 2018-10-04 | Discharge: 2018-10-04 | Disposition: A | Discharge status: Home / Self Care | Payer: Medicaid Other | Source: Ambulatory Visit | Attending: Student | Admitting: Student

## 2018-10-04 ENCOUNTER — Encounter (HOSPITAL_COMMUNITY): Payer: Self-pay

## 2018-10-04 ENCOUNTER — Ambulatory Visit (HOSPITAL_COMMUNITY): Payer: Medicaid Other | Admitting: *Deleted

## 2018-10-04 VITALS — BP 106/64 | HR 94 | Wt 200.2 lb

## 2018-10-04 DIAGNOSIS — Z3A38 38 weeks gestation of pregnancy: Secondary | ICD-10-CM | POA: Diagnosis not present

## 2018-10-04 DIAGNOSIS — O359XX0 Maternal care for (suspected) fetal abnormality and damage, unspecified, not applicable or unspecified: Secondary | ICD-10-CM

## 2018-10-04 DIAGNOSIS — O3663X0 Maternal care for excessive fetal growth, third trimester, not applicable or unspecified: Secondary | ICD-10-CM | POA: Diagnosis not present

## 2018-10-04 DIAGNOSIS — O09523 Supervision of elderly multigravida, third trimester: Secondary | ICD-10-CM | POA: Diagnosis not present

## 2018-10-06 ENCOUNTER — Encounter: Payer: Self-pay | Admitting: Obstetrics and Gynecology

## 2018-10-06 ENCOUNTER — Ambulatory Visit (INDEPENDENT_AMBULATORY_CARE_PROVIDER_SITE_OTHER): Payer: Medicaid Other | Admitting: Obstetrics and Gynecology

## 2018-10-06 ENCOUNTER — Other Ambulatory Visit: Payer: Self-pay

## 2018-10-06 VITALS — BP 91/62 | HR 92 | Wt 198.9 lb

## 2018-10-06 DIAGNOSIS — O09523 Supervision of elderly multigravida, third trimester: Secondary | ICD-10-CM

## 2018-10-06 DIAGNOSIS — Z789 Other specified health status: Secondary | ICD-10-CM

## 2018-10-06 DIAGNOSIS — Z3480 Encounter for supervision of other normal pregnancy, unspecified trimester: Secondary | ICD-10-CM

## 2018-10-06 DIAGNOSIS — D563 Thalassemia minor: Secondary | ICD-10-CM

## 2018-10-06 DIAGNOSIS — O401XX0 Polyhydramnios, first trimester, not applicable or unspecified: Secondary | ICD-10-CM

## 2018-10-06 DIAGNOSIS — O09522 Supervision of elderly multigravida, second trimester: Secondary | ICD-10-CM

## 2018-10-06 NOTE — Progress Notes (Signed)
   PRENATAL VISIT NOTE  Subjective:  Brenda Tate is a 38 y.o. G3P2002 at [redacted]w[redacted]d being seen today for ongoing prenatal care.  She is currently monitored for the following issues for this high-risk pregnancy and has Dyspareunia, female; Myopia of both eyes; Constipation; Advanced maternal age in multigravida, second trimester; Supervision of other normal pregnancy, antepartum; Alpha thalassemia trait; Polyhydramnios; and Language barrier on their problem list.  Patient reports no complaints.  Contractions: Not present. Vag. Bleeding: None.  Movement: Present. Denies leaking of fluid.   The following portions of the patient's history were reviewed and updated as appropriate: allergies, current medications, past family history, past medical history, past social history, past surgical history and problem list.   Objective:   Vitals:   10/06/18 1023  BP: 91/62  Pulse: 92  Weight: 198 lb 14.4 oz (90.2 kg)    Fetal Status: Fetal Heart Rate (bpm): 151   Movement: Present     General:  Alert, oriented and cooperative. Patient is in no acute distress.  Skin: Skin is warm and dry. No rash noted.   Cardiovascular: Normal heart rate noted  Respiratory: Normal respiratory effort, no problems with respiration noted  Abdomen: Soft, gravid, appropriate for gestational age.  Pain/Pressure: Absent     Pelvic: Cervical exam performed      1/thick/high/soft  Extremities: Normal range of motion.  Edema: None  Mental Status: Normal mood and affect. Normal behavior. Normal judgment and thought content.   Assessment and Plan:  Pregnancy: G3P2002 at [redacted]w[redacted]d 1. Supervision of other normal pregnancy, antepartum Last growth 3961 gms on 10/04/18  2. Advanced maternal age in multigravida, second trimester  3. Alpha thalassemia trait  4. Polyhydramnios in first trimester, single or unspecified fetus Last AFI 11, before that was 18 Will schedule for AFI next week given drop in fluid, patient denies leaking  5. Language barrier Live Fish farm manager used  Term labor symptoms and general obstetric precautions including but not limited to vaginal bleeding, contractions, leaking of fluid and fetal movement were reviewed in detail with the patient. Please refer to After Visit Summary for other counseling recommendations.   Return in about 1 week (around 10/13/2018) for OB visit (MD).  No future appointments.  Sloan Leiter, MD

## 2018-10-14 ENCOUNTER — Ambulatory Visit (INDEPENDENT_AMBULATORY_CARE_PROVIDER_SITE_OTHER): Payer: Medicaid Other | Admitting: Family Medicine

## 2018-10-14 ENCOUNTER — Ambulatory Visit: Payer: Self-pay

## 2018-10-14 ENCOUNTER — Other Ambulatory Visit (HOSPITAL_COMMUNITY): Payer: Self-pay | Admitting: Advanced Practice Midwife

## 2018-10-14 ENCOUNTER — Other Ambulatory Visit: Payer: Self-pay

## 2018-10-14 ENCOUNTER — Ambulatory Visit (INDEPENDENT_AMBULATORY_CARE_PROVIDER_SITE_OTHER): Payer: Medicaid Other | Admitting: *Deleted

## 2018-10-14 VITALS — BP 96/66 | HR 92 | Wt 198.5 lb

## 2018-10-14 DIAGNOSIS — O288 Other abnormal findings on antenatal screening of mother: Secondary | ICD-10-CM

## 2018-10-14 DIAGNOSIS — Z3A39 39 weeks gestation of pregnancy: Secondary | ICD-10-CM | POA: Diagnosis not present

## 2018-10-14 DIAGNOSIS — Z3480 Encounter for supervision of other normal pregnancy, unspecified trimester: Secondary | ICD-10-CM

## 2018-10-14 DIAGNOSIS — O09522 Supervision of elderly multigravida, second trimester: Secondary | ICD-10-CM

## 2018-10-14 DIAGNOSIS — Z3483 Encounter for supervision of other normal pregnancy, third trimester: Secondary | ICD-10-CM | POA: Diagnosis not present

## 2018-10-14 DIAGNOSIS — O09523 Supervision of elderly multigravida, third trimester: Secondary | ICD-10-CM

## 2018-10-14 NOTE — Patient Instructions (Signed)

## 2018-10-14 NOTE — Progress Notes (Signed)
Video interpreter Claudine # [id number] used for encounter.   Pt informed that the ultrasound is considered a limited OB ultrasound and is not intended to be a complete ultrasound exam.  Patient also informed that the ultrasound is not being completed with the intent of assessing for fetal or placental anomalies or any pelvic abnormalities.  Explained that the purpose of today's ultrasound is to assess for presentation and amniotic fluid volume.  Patient acknowledges the purpose of the exam and the limitations of the study.

## 2018-10-14 NOTE — Progress Notes (Signed)
   PRENATAL VISIT NOTE  Subjective:  Brenda Tate is a 38 y.o. G3P2002 at [redacted]w[redacted]d being seen today for ongoing prenatal care.  She is currently monitored for the following issues for this high-risk pregnancy and has Dyspareunia, female; Myopia of both eyes; Constipation; Advanced maternal age in multigravida, second trimester; Supervision of other normal pregnancy, antepartum; Alpha thalassemia trait; Polyhydramnios; and Language barrier on their problem list.  Patient reports no complaints.  Contractions: Not present. Vag. Bleeding: None.  Movement: Present. Denies leaking of fluid.   The following portions of the patient's history were reviewed and updated as appropriate: allergies, current medications, past family history, past medical history, past social history, past surgical history and problem list.   Objective:   Vitals:   10/14/18 1346  BP: 96/66  Pulse: 92  Weight: 198 lb 8 oz (90 kg)    Fetal Status: Fetal Heart Rate (bpm): 164   Movement: Present     General:  Alert, oriented and cooperative. Patient is in no acute distress.  Skin: Skin is warm and dry. No rash noted.   Cardiovascular: Normal heart rate noted  Respiratory: Normal respiratory effort, no problems with respiration noted  Abdomen: Soft, gravid, appropriate for gestational age.  Pain/Pressure: Absent     Pelvic: Cervical exam deferred        Extremities: Normal range of motion.  Edema: None  Mental Status: Normal mood and affect. Normal behavior. Normal judgment and thought content.   Assessment and Plan:  Pregnancy: G3P2002 at [redacted]w[redacted]d 1. Supervision of other normal pregnancy, antepartum Has LGA, h/o mild poly--will schedule delivery at 40+ wks  2. Nonspecific abnormal finding in amniotic fluid in third trimester Normal fluid  3. Advanced maternal age in multigravida, second trimester Nml NIPT  Arabic video interpreter used.  Preterm labor symptoms and general obstetric precautions including but  not limited to vaginal bleeding, contractions, leaking of fluid and fetal movement were reviewed in detail with the patient. Please refer to After Visit Summary for other counseling recommendations.   No follow-ups on file.  Future Appointments  Date Time Provider [place]  10/17/2018  7:30 AM [facility] SCHED ROOM [facility] None    Donnamae Jude, MD

## 2018-10-15 ENCOUNTER — Other Ambulatory Visit (HOSPITAL_COMMUNITY): Payer: Self-pay | Admitting: *Deleted

## 2018-10-17 ENCOUNTER — Inpatient Hospital Stay (HOSPITAL_COMMUNITY): Payer: Medicaid Other | Admitting: Anesthesiology

## 2018-10-17 ENCOUNTER — Encounter (HOSPITAL_COMMUNITY): Payer: Self-pay | Admitting: *Deleted

## 2018-10-17 ENCOUNTER — Other Ambulatory Visit: Payer: Self-pay

## 2018-10-17 ENCOUNTER — Inpatient Hospital Stay (HOSPITAL_COMMUNITY): Payer: Medicaid Other

## 2018-10-17 ENCOUNTER — Inpatient Hospital Stay (HOSPITAL_COMMUNITY)
Admission: AD | Admit: 2018-10-17 | Discharge: 2018-10-19 | DRG: 807 | Disposition: A | Discharge status: Home / Self Care | Payer: Medicaid Other | Attending: Family Medicine | Admitting: Family Medicine

## 2018-10-17 DIAGNOSIS — M542 Cervicalgia: Secondary | ICD-10-CM | POA: Diagnosis present

## 2018-10-17 DIAGNOSIS — O480 Post-term pregnancy: Secondary | ICD-10-CM | POA: Diagnosis not present

## 2018-10-17 DIAGNOSIS — Z3480 Encounter for supervision of other normal pregnancy, unspecified trimester: Secondary | ICD-10-CM

## 2018-10-17 DIAGNOSIS — O3663X0 Maternal care for excessive fetal growth, third trimester, not applicable or unspecified: Principal | ICD-10-CM | POA: Diagnosis present

## 2018-10-17 DIAGNOSIS — O09522 Supervision of elderly multigravida, second trimester: Secondary | ICD-10-CM | POA: Diagnosis present

## 2018-10-17 DIAGNOSIS — D563 Thalassemia minor: Secondary | ICD-10-CM | POA: Diagnosis present

## 2018-10-17 DIAGNOSIS — O401XX0 Polyhydramnios, first trimester, not applicable or unspecified: Secondary | ICD-10-CM

## 2018-10-17 DIAGNOSIS — Z3A40 40 weeks gestation of pregnancy: Secondary | ICD-10-CM

## 2018-10-17 DIAGNOSIS — O409XX0 Polyhydramnios, unspecified trimester, not applicable or unspecified: Secondary | ICD-10-CM | POA: Diagnosis present

## 2018-10-17 DIAGNOSIS — O403XX0 Polyhydramnios, third trimester, not applicable or unspecified: Secondary | ICD-10-CM | POA: Diagnosis present

## 2018-10-17 DIAGNOSIS — O9089 Other complications of the puerperium, not elsewhere classified: Secondary | ICD-10-CM | POA: Diagnosis present

## 2018-10-17 DIAGNOSIS — Z603 Acculturation difficulty: Secondary | ICD-10-CM | POA: Diagnosis present

## 2018-10-17 DIAGNOSIS — Z789 Other specified health status: Secondary | ICD-10-CM | POA: Diagnosis present

## 2018-10-17 LAB — TYPE AND SCREEN
ABO/RH(D): B POS
ANTIBODY SCREEN: NEGATIVE

## 2018-10-17 LAB — CBC
HCT: 35.1 % — ABNORMAL LOW (ref 36.0–46.0)
HCT: 38.7 % (ref 36.0–46.0)
HEMOGLOBIN: 12.6 g/dL (ref 12.0–15.0)
Hemoglobin: 11.9 g/dL — ABNORMAL LOW (ref 12.0–15.0)
MCH: 27.3 pg (ref 26.0–34.0)
MCH: 26.3 pg (ref 26.0–34.0)
MCHC: 33.9 g/dL (ref 30.0–36.0)
MCHC: 32.6 g/dL (ref 30.0–36.0)
MCV: 80.5 fL (ref 80.0–100.0)
MCV: 80.8 fL (ref 80.0–100.0)
NRBC: 0 % (ref 0.0–0.2)
Platelets: 111 10*3/uL — ABNORMAL LOW (ref 150–400)
Platelets: 105 10*3/uL — ABNORMAL LOW (ref 150–400)
RBC: 4.36 MIL/uL (ref 3.87–5.11)
RBC: 4.79 MIL/uL (ref 3.87–5.11)
RDW: 13.6 % (ref 11.5–15.5)
RDW: 13.6 % (ref 11.5–15.5)
WBC: 6.1 10*3/uL (ref 4.0–10.5)
WBC: 9.7 10*3/uL (ref 4.0–10.5)
nRBC: 0 % (ref 0.0–0.2)

## 2018-10-17 LAB — ABO/RH: ABO/RH(D): B POS

## 2018-10-17 MED ORDER — OXYTOCIN BOLUS FROM INFUSION
500.0000 mL | Freq: Once | INTRAVENOUS | Status: AC
  Administered 2018-10-17: 500 mL via INTRAVENOUS

## 2018-10-17 MED ORDER — EPHEDRINE 5 MG/ML INJ: 10.0000 mg | INTRAVENOUS | Status: DC | PRN

## 2018-10-17 MED ORDER — MISOPROSTOL 25 MCG QUARTER TABLET
25.0000 ug | ORAL_TABLET | ORAL | Status: DC | PRN
  Administered 2018-10-17: 25 ug via VAGINAL
  Filled 2018-10-17: qty 1

## 2018-10-17 MED ORDER — OXYTOCIN 40 UNITS IN NORMAL SALINE INFUSION - SIMPLE MED
1.0000 m[IU]/min | INTRAVENOUS | Status: DC
  Administered 2018-10-17: 2 m[IU]/min via INTRAVENOUS

## 2018-10-17 MED ORDER — FENTANYL-BUPIVACAINE-NACL 0.5-0.125-0.9 MG/250ML-% EP SOLN
12.0000 mL/h | EPIDURAL | Status: DC | PRN
  Filled 2018-10-17: qty 250

## 2018-10-17 MED ORDER — LACTATED RINGERS IV SOLN: 500.0000 mL | Freq: Once | INTRAVENOUS | Status: DC

## 2018-10-17 MED ORDER — FLEET ENEMA 7-19 GM/118ML RE ENEM: 1.0000 | ENEMA | RECTAL | Status: DC | PRN

## 2018-10-17 MED ORDER — DIPHENHYDRAMINE HCL 50 MG/ML IJ SOLN: 12.5000 mg | INTRAMUSCULAR | Status: DC | PRN

## 2018-10-17 MED ORDER — PHENYLEPHRINE 40 MCG/ML (10ML) SYRINGE FOR IV PUSH (FOR BLOOD PRESSURE SUPPORT)
80.0000 ug | PREFILLED_SYRINGE | INTRAVENOUS | Status: AC | PRN
  Administered 2018-10-17 (×3): 80 ug via INTRAVENOUS

## 2018-10-17 MED ORDER — OXYTOCIN 40 UNITS IN NORMAL SALINE INFUSION - SIMPLE MED
2.5000 [IU]/h | INTRAVENOUS | Status: DC
  Filled 2018-10-17: qty 1000

## 2018-10-17 MED ORDER — LIDOCAINE HCL (PF) 1 % IJ SOLN: 30.0000 mL | INTRAMUSCULAR | Status: DC | PRN

## 2018-10-17 MED ORDER — OXYTOCIN 40 UNITS IN NORMAL SALINE INFUSION - SIMPLE MED: 1.0000 m[IU]/min | INTRAVENOUS | Status: DC

## 2018-10-17 MED ORDER — TERBUTALINE SULFATE 1 MG/ML IJ SOLN: 0.2500 mg | Freq: Once | INTRAMUSCULAR | Status: DC | PRN

## 2018-10-17 MED ORDER — ONDANSETRON HCL 4 MG/2ML IJ SOLN: 4.0000 mg | Freq: Four times a day (QID) | INTRAMUSCULAR | Status: DC | PRN

## 2018-10-17 MED ORDER — OXYCODONE-ACETAMINOPHEN 5-325 MG PO TABS: 2.0000 | ORAL_TABLET | ORAL | Status: DC | PRN

## 2018-10-17 MED ORDER — LIDOCAINE HCL (PF) 1 % IJ SOLN
INTRAMUSCULAR | Status: DC | PRN
  Administered 2018-10-17: 6 mL via EPIDURAL

## 2018-10-17 MED ORDER — LACTATED RINGERS IV SOLN
INTRAVENOUS | Status: DC
  Administered 2018-10-17 (×3): via INTRAVENOUS

## 2018-10-17 MED ORDER — SOD CITRATE-CITRIC ACID 500-334 MG/5ML PO SOLN: 30.0000 mL | ORAL | Status: DC | PRN

## 2018-10-17 MED ORDER — SODIUM CHLORIDE (PF) 0.9 % IJ SOLN
INTRAMUSCULAR | Status: DC | PRN
  Administered 2018-10-17: 12 mL/h via EPIDURAL

## 2018-10-17 MED ORDER — LACTATED RINGERS IV SOLN: 500.0000 mL | INTRAVENOUS | Status: DC | PRN

## 2018-10-17 MED ORDER — PHENYLEPHRINE 40 MCG/ML (10ML) SYRINGE FOR IV PUSH (FOR BLOOD PRESSURE SUPPORT)
80.0000 ug | PREFILLED_SYRINGE | INTRAVENOUS | Status: DC | PRN
  Filled 2018-10-17: qty 10

## 2018-10-17 MED ORDER — ACETAMINOPHEN 325 MG PO TABS: 650.0000 mg | ORAL_TABLET | ORAL | Status: DC | PRN

## 2018-10-17 MED ORDER — OXYCODONE-ACETAMINOPHEN 5-325 MG PO TABS: 1.0000 | ORAL_TABLET | ORAL | Status: DC | PRN

## 2018-10-17 NOTE — Anesthesia Postprocedure Evaluation (Signed)
Anesthesia Post Note  Patient: Brenda Tate  Procedure(s) Performed: AN AD HOC LABOR EPIDURAL     Patient location during evaluation: Mother Baby Anesthesia Type: Epidural Level of consciousness: awake and alert Pain management: pain level controlled Vital Signs Assessment: post-procedure vital signs reviewed and stable Respiratory status: spontaneous breathing, nonlabored ventilation and respiratory function stable Cardiovascular status: stable Postop Assessment: no headache, no backache and epidural receding Anesthetic complications: no    Last Vitals:  Vitals:   10/17/18 2226 10/17/18 2231  BP: (!) 83/51 99/65  Pulse: 93 97  Resp: 16   Temp: 36.8 C   SpO2:      Last Pain:  Vitals:   10/17/18 2226  TempSrc: Oral  PainSc:    Pain Goal:                   Brenda Tate

## 2018-10-17 NOTE — Plan of Care (Signed)
Pt uncomfortable but resting in bed. Informed to call RN when she desires epidural.  Ipad at bedside for Arabic interpreter when necessary.  Will continue to monitor and communicate with team as needed.   Problem: Education: Goal: Knowledge of General Education information will improve Description Including pain rating scale, medication(s)/side effects and non-pharmacologic comfort measures Outcome: Progressing   Problem: Health Behavior/Discharge Planning: Goal: Ability to manage health-related needs will improve Outcome: Progressing   Problem: Clinical Measurements: Goal: Ability to maintain clinical measurements within normal limits will improve Outcome: Progressing Goal: Will remain free from infection Outcome: Progressing Goal: Diagnostic test results will improve Outcome: Progressing Goal: Respiratory complications will improve Outcome: Progressing Goal: Cardiovascular complication will be avoided Outcome: Progressing   Problem: Activity: Goal: Risk for activity intolerance will decrease Outcome: Progressing   Problem: Nutrition: Goal: Adequate nutrition will be maintained Outcome: Progressing   Problem: Coping: Goal: Level of anxiety will decrease Outcome: Progressing   Problem: Elimination: Goal: Will not experience complications related to bowel motility Outcome: Progressing Goal: Will not experience complications related to urinary retention Outcome: Progressing   Problem: Pain Managment: Goal: General experience of comfort will improve Outcome: Progressing   Problem: Safety: Goal: Ability to remain free from injury will improve Outcome: Progressing   Problem: Skin Integrity: Goal: Risk for impaired skin integrity will decrease Outcome: Progressing   Problem: Education: Goal: Knowledge of Childbirth will improve Outcome: Progressing Goal: Ability to make informed decisions regarding treatment and plan of care will improve Outcome:  Progressing Goal: Ability to state and carry out methods to decrease the pain will improve Outcome: Progressing Goal: Individualized Educational Video(s) Outcome: Progressing   Problem: Coping: Goal: Ability to verbalize concerns and feelings about labor and delivery will improve Outcome: Progressing   Problem: Life Cycle: Goal: Ability to make normal progression through stages of labor will improve Outcome: Progressing Goal: Ability to effectively push during vaginal delivery will improve Outcome: Progressing   Problem: Role Relationship: Goal: Will demonstrate positive interactions with the child Outcome: Progressing   Problem: Safety: Goal: Risk of complications during labor and delivery will decrease Outcome: Progressing   Problem: Pain Management: Goal: Relief or control of pain from uterine contractions will improve Outcome: Progressing

## 2018-10-17 NOTE — Progress Notes (Signed)
[facility]N Faculty [facility]: Labor Progress Note  Subjective: Feeling mild contractions. Husband at bedside for support  Objective: BP (!) 93/53   Pulse 84   Temp 98.1 F (36.7 C) (Oral)   Resp 14   Ht 5' 9.29" (1.76 m)   Wt 89.8 kg   LMP 01/09/2018 (Exact Date)   BMI 28.99 kg/m  Gen: no distress Dilation: 4 Effacement (%): 50 Station: -2 Presentation: Vertex Exam by:: Dr. Tammi Klippel  Assessment and Plan: 38 y.o. [id number] [redacted]w[redacted]d here for IOL for AMA with LGA  Labor: FB out at 1430, starting pitocin 2x2 -- pain control: planning for natural delivery -- PPH Risk: low  Fetal Well-Being: EFW 3961g with >97%tile AC on [place] 10/04/2018.Cephalic by exam.  -- Category 1 - continuous fetal monitoring  -- GBS negative   Orlene Plum, MD Family Medicine Resident 5:20 PM

## 2018-10-17 NOTE — Progress Notes (Signed)
Admission completed via video interpreter.

## 2018-10-17 NOTE — Anesthesia Procedure Notes (Signed)
Epidural Patient location during procedure: OB Start time: 10/17/2018 9:53 PM End time: 10/17/2018 9:57 PM  Staffing Anesthesiologist: Janeece Riggers, MD  Preanesthetic Checklist Completed: patient identified, site marked, surgical consent, pre-op evaluation, timeout performed, IV checked, risks and benefits discussed and monitors and equipment checked  Epidural Patient position: sitting Prep: site prepped and draped and DuraPrep Patient monitoring: continuous pulse ox and blood pressure Approach: midline Location: L3-L4 Injection technique: LOR air  Needle:  Needle type: Tuohy  Needle gauge: 17 G Needle length: 9 cm and 9 Needle insertion depth: 5 cm cm Catheter type: closed end flexible Catheter size: 19 Gauge Catheter at skin depth: 10 cm Test dose: negative  Assessment Events: blood not aspirated, injection not painful, no injection resistance, negative IV test and no paresthesia

## 2018-10-17 NOTE — Progress Notes (Signed)
OB/[facility]N Faculty Practice: Labor Progress Note  Subjective: Doing well, feeling occasional very mild contractions. Husband at bedside for support  Objective: BP 121/74   Pulse 82   Temp 98.9 F (37.2 C) (Oral)   Resp 16   Ht 5' 9.29" (1.76 m)   Wt 89.8 kg   LMP 01/09/2018 (Exact Date)   BMI 28.99 kg/m  Gen: well appearing, no distress Dilation: 2 Effacement (%): 60 Station: -2 Presentation: Vertex Exam by:: Dr. Tammi Klippel  Assessment and Plan: 38 y.o. [id number] [redacted]w[redacted]d here for IOL for AMA with LGA.   Labor: cytotec x1 4hrs ago, FB placed, will hold on additional cytotec at this time as I would expect FB will be out soon and plan to start pitocin  -- pain control: planning for natural delivery -- PPH Risk: low  Fetal Well-Being: EFW 3961g with >97%tile AC on [place] 10/04/2018.  Cephalic by exam.  -- Category 1 - continuous fetal monitoring  -- GBS negative  Orlene Plum, MD Family Medicine Resident 12:28 PM

## 2018-10-17 NOTE — H&P (Addendum)
OBSTETRIC ADMISSION HISTORY AND PHYSICAL  Brenda Tate is a 38 y.o. female G3P2002 with IUP at [redacted]w[redacted]d by LMP presenting for IOL. Reports fetal movement. Denies vaginal bleeding, LOF, contractions. Denies any medications other than PNV. Reports prior 2 deliveries were uncomplicated vaginal deliveries.   She received her prenatal care at [facility].  Support person in labor: Husband  Ultrasounds . Anatomy U/S: normal w/ limited views of heart . Polyhydramnios noted on [place] 07/16/2018, AFI 25.67 . AFI 11.39 on 10/04/2018  Prenatal History/Complications: . AMA . Alpha thalassemia trait  Past Medical History: Past Medical History:  Diagnosis Date  . Encounter for supervision of normal first pregnancy in second trimester 05/11/2018    Nursing Staff Provider Office Location [place]  Dating  Certain LMP  Language  Arabic  Anatomy US  Scheduled  Flu Vaccine  05/11/18 Genetic Screen  NIPS:  Low risk female AFP: negative TDaP vaccine    Hgb A1C or  GTT Early  Third trimester  Rhogam     LAB RESULTS  Feeding Plan Breast  Blood Type B/Positive/-- (10/15 1555)  Contraception Undecided  Antibody Negative (10/15 1555) Circumcision If boy,   Past Surgical History: Past Surgical History:  Procedure Laterality Date  . NO PAST SURGERIES     Obstetrical History: OB History    Gravida  3   Para  2   Term  2   Preterm      AB      Living  2     SAB      TAB      Ectopic      Multiple      Live Births  2          Social History: Social History   Socioeconomic History  . Marital status: Married    Spouse name: Not on file  . Number of children: Not on file  . Years of education: Not on file  . Highest education level: Not on file  Occupational History  . Not on file  Social Needs  . Financial resource strain: Not on file  . Food insecurity:    Worry: Not on file    Inability: Not on file  . Transportation needs:    Medical: Not on file    Non-medical: Not on file  Tobacco Use   . Smoking status: Never Smoker  . Smokeless tobacco: Never Used  Substance and Sexual Activity  . Alcohol use: No  . Drug use: No  . Sexual activity: Yes  Lifestyle  . Physical activity:    Days per week: Not on file    Minutes per session: Not on file  . Stress: Not on file  Relationships  . Social connections:    Talks on phone: Not on file    Gets together: Not on file    Attends religious service: Not on file    Active member of club or organization: Not on file    Attends meetings of clubs or organizations: Not on file    Relationship status: Not on file  Other Topics Concern  . Not on file  Social History Narrative  . Not on file   Family History: Family History  Problem Relation Age of Onset  . Heart disease Neg Hx   . Cancer Neg Hx    Allergies: No Known Allergies  Medications Prior to Admission  Medication Sig Dispense Refill Last Dose  . Elastic Bandages & Supports (COMFORT FIT MATERNITY SUPP LG) MISC 1  Units by Does not apply route as needed. (Patient not taking: Reported on 09/30/2018) 1 each 0 Not Taking  . Prenatal Vit-Fe Fumarate-FA (PRENATAL VITAMINS) 28-0.8 MG TABS Take 1 tablet by mouth daily. 60 tablet 3 Taking   Review of Systems  All systems reviewed and negative except as stated in HPI  Blood pressure 112/71, pulse 95, temperature 98.9 F (37.2 C), temperature source Oral, resp. rate 18, height 5' 9.29" (1.76 m), weight 89.8 kg, last menstrual period 01/09/2018. General appearance: alert, cooperative and no distress Lungs: no respiratory distress Heart: regular rate  Abdomen: soft, non-tender; gravid abdomen Extremities: Homans sign is negative, no sign of DVT Presentation: cephalic Fetal monitoring: baseline 130bpm, +accels, no decels, good variability Uterine activity: q4-10 minutes with irritability Dilation: 1 Effacement (%): 20 Station: -3 Exam by:: Brenda Asal, RN  Prenatal labs: ABO, Rh: --/--/B POS Performed at [facility], [place] [address], [place], [place] [address]  [phone]/22 0805) Antibody: NEG (03/22 0726) Rubella: 18.70 (10/15 1555) RPR: Non Reactive (12/31 0906)  HBsAg: Negative (10/15 1555)  HIV: Non Reactive (12/31 0906)  GBS:   negative Glucola: normal Genetic screening:  NIPs - low risk, AFP engative  Prenatal Transfer Tool  Maternal Diabetes: No Genetic Screening: Normal Maternal Ultrasounds/Referrals: Normal Fetal Ultrasounds or other Referrals:  Other: Polyhydramnios in 2nd trimester Maternal Substance Abuse:  No Significant Maternal Medications:  None Significant Maternal Lab Results: None  Results for orders placed or performed during the hospital encounter of 10/17/18 (from the past 24 hour(s))  Type and screen   Collection Time: 10/17/18  7:26 AM  Result Value Ref Range   ABO/RH(D) B POS    Antibody Screen NEG    Sample Expiration      10/20/2018 Performed at [facility], [place] [address], [place], [place] [address]   ABO/Rh   Collection Time: 10/17/18  8:05 AM  Result Value Ref Range   ABO/RH(D)      B POS Performed at [facility] [address] [place], [place] [address]    Patient Active Problem List   Diagnosis Date Noted  . Post term pregnancy over 40 weeks 10/17/2018  . Language barrier 09/30/2018  . Polyhydramnios 07/27/2018  . Alpha thalassemia trait 05/24/2018  . Advanced maternal age in multigravida, second trimester 05/11/2018  . Supervision of other normal pregnancy, antepartum 05/11/2018  . Myopia of both eyes 01/12/2018  . Constipation 01/12/2018  . Dyspareunia, female 12/09/2016    Assessment/Plan:  Brenda Tate is a 38 y.o. G3P2002 at [redacted]w[redacted]d here for IOL for AMA with LGA. EFW 3961g with >97%tile AC on [place] 10/04/2018.   Labor: vaginal cytotec x1, plan for FB at next cervical check  -- pain control: planning for natural delivery, continue to monitor  Fetal Wellbeing: EFW 3961g with >97%tile AC on [place] 10/04/2018.  Cephalic by exam.  -- GBS  negative -- continuous fetal monitoring - Cat 1   Postpartum Planning -- breast/nexplanon -- Rh+ / given Tdap   Brenda Plum, MD Family Medicine Resident    OB FELLOW HISTORY AND PHYSICAL ATTESTATION  I have seen and examined this patient; I agree with above documentation in the resident's note.   Phill Myron, D.O. OB Fellow  10/18/2018, 3:29 PM

## 2018-10-17 NOTE — Discharge Summary (Addendum)
Postpartum Discharge Summary     Patient Name: Brenda Tate DOB: [birth year] MRN: [id number]  Date of admission: 10/17/2018 Delivering Provider: Wende Mott   Date of discharge: 10/19/2018  Admitting diagnosis: pregnancy Intrauterine pregnancy: [redacted]w[redacted]d     Secondary diagnosis:  Active Problems:   Advanced maternal age in multigravida, second trimester   Alpha thalassemia trait   Polyhydramnios   Language barrier   Post term pregnancy over 40 weeks  Additional problems: None     Discharge diagnosis: Term Pregnancy Delivered                                                                                                Post partum procedures:n/a  Augmentation: Pitocin, Cytotec and Foley Balloon  Complications: None  Hospital course:  Induction of Labor With Vaginal Delivery   38 y.o. yo G3P2002 at [redacted]w[redacted]d was admitted to the hospital 10/17/2018 for induction of labor.  Indication for induction: AMA.  Patient had an uncomplicated labor course as follows: Membrane Rupture Time/Date: 11:00 PM ,10/17/2018   Intrapartum Procedures: Episiotomy: None [1]                                         Lacerations:  1st degree [2]  Patient had delivery of a Viable infant.  Information for the patient's newborn:  Arnesha, Schiraldi [[id number]]  Delivery Method: Vaginal, Spontaneous(Filed from Delivery Summary)   10/17/2018  Details of delivery can be found in separate delivery note.  Patient had a routine postpartum course. Patient is discharged home 10/19/18.  Magnesium Sulfate recieved: No BMZ received: No  Physical exam  Vitals:   10/18/18 1116 10/18/18 1519 10/18/18 2229 10/19/18 0513  BP: 96/62 100/80 91/73 (!) 90/59  Pulse: 82 89 91 84  Resp: 17 18 13    Temp: 98.2 F (36.8 C) 97.7 F (36.5 C) 98.7 F (37.1 C) 98 F (36.7 C)  TempSrc: Oral Oral Oral Oral  SpO2:   94%   Weight:      Height:       General: alert, cooperative and no distress Lochia:  appropriate Uterine Fundus: firm Incision: N/A DVT Evaluation: No evidence of DVT seen on physical exam. No cords or calf tenderness. No significant calf/ankle edema. Labs: Lab Results  Component Value Date   WBC 11.5 (H) 10/18/2018   HGB 11.5 (L) 10/18/2018   HCT 34.3 (L) 10/18/2018   MCV 80.5 10/18/2018   PLT 106 (L) 10/18/2018   No flowsheet data found.  Discharge instruction: per After Visit Summary and "Baby and Me Booklet".  After visit meds:  Allergies as of 10/19/2018   No Known Allergies     Medication List    STOP taking these medications   Comfort Fit Maternity Supp Lg Misc     TAKE these medications   ibuprofen 600 MG tablet Commonly known as:  ADVIL,MOTRIN Take 1 tablet (600 mg total) by mouth every 6 (six) hours.   Prenatal Vitamins 28-0.8 MG Tabs Take 1 tablet  by mouth daily.   senna-docusate 8.6-50 MG tablet Commonly known as:  Senokot-S Take 2 tablets by mouth daily.       Diet: low salt diet  Activity: Advance as tolerated. Pelvic rest for 6 weeks.   Outpatient follow up:6 weeks Follow up Appt: Future Appointments  Date Time Provider [place]  11/15/2018  1:15 PM Tresea Mall, CNM [place]   Follow up Visit: [place] [facility] Go on 11/15/2018.   Specialty:  Obstetrics and Gynecology Why:  at 1:15 pm for postpartum visit Contact information: [place] [phone]           Please schedule this patient for PP visit in: 4 weeks Low risk pregnancy complicated by: n/a Delivery mode:  SVD Anticipated Birth Control:  Nexplanon PP Procedures needed: n/a  Schedule Integrated BH visit: no Provider: Any provider    Newborn Data: Live born female  Birth Weight:   APGAR: ,   Newborn Delivery   Birth date/time:  10/17/2018 23:14:00 Delivery type:  Vaginal, Spontaneous     Baby Feeding: Breast Disposition:home with  mother  I confirm that I have verified the information documented in the resident's note and that I have also personally reperformed the history, physical exam and all medical decision making activities of this service and have verified that all service and findings are accurately documented in this student's note.   iPad interpreter used for all patient interaction  Darlina Rumpf, [place] 10/19/2018 7:59 AM

## 2018-10-17 NOTE — Progress Notes (Signed)
Labor Progress Note Aura Bibby is a 38 y.o. G3P2002 at [redacted]w[redacted]d presented for IOL for AMA  S:  Patient reporting worsening pain. Requesting epidural  O:  BP (!) 96/57   Pulse 89   Temp 98.1 F (36.7 C) (Oral)   Resp 18   Ht 5' 9.29" (1.76 m)   Wt 89.8 kg   LMP 01/09/2018 (Exact Date)   BMI 28.99 kg/m   Fetal Tracing:  Baseline: 130 Variability: moderate Accels: 15x15 Decels: none  Toco: 1-3   CVE: Dilation: 4 Effacement (%): 50 Station: -2 Presentation: Vertex Exam by:: Dr. Tammi Klippel   A&P: 38 y.o. [id number] [redacted]w[redacted]d IOL for AMA #Labor: Progressing well. Will reassess after epidural #Pain: epidural #FWB: Cat 1 #GBS negative   Wende Mott, CNM 9:54 PM

## 2018-10-17 NOTE — Anesthesia Preprocedure Evaluation (Signed)
Anesthesia Evaluation  Patient identified by MRN, date of birth, ID band Patient awake    Reviewed: Allergy & Precautions, H&P , NPO status , Patient's Chart, lab work & pertinent test results, reviewed documented beta blocker date and time   Airway Mallampati: II  TM Distance: >3 FB Neck ROM: full    Dental no notable dental hx. (+) Teeth Intact   Pulmonary neg pulmonary ROS,    Pulmonary exam normal breath sounds clear to auscultation       Cardiovascular negative cardio ROS Normal cardiovascular exam Rhythm:regular Rate:Normal     Neuro/Psych negative neurological ROS  negative psych ROS   GI/Hepatic negative GI ROS, Neg liver ROS,   Endo/Other  negative endocrine ROS  Renal/GU negative Renal ROS  negative genitourinary   Musculoskeletal   Abdominal   Peds  Hematology  (+) Blood dyscrasia, anemia ,   Anesthesia Other Findings   Reproductive/Obstetrics (+) Pregnancy                             Anesthesia Physical Anesthesia Plan  ASA: II  Anesthesia Plan: Epidural   Post-op Pain Management:    Induction:   PONV Risk Score and Plan:   Airway Management Planned:   Additional Equipment:   Intra-op Plan:   Post-operative Plan:   Informed Consent: I have reviewed the patients History and Physical, chart, labs and discussed the procedure including the risks, benefits and alternatives for the proposed anesthesia with the patient or authorized representative who has indicated his/her understanding and acceptance.       Plan Discussed with: Anesthesiologist and Surgeon  Anesthesia Plan Comments:         Anesthesia Quick Evaluation

## 2018-10-18 ENCOUNTER — Encounter (HOSPITAL_COMMUNITY): Payer: Self-pay

## 2018-10-18 ENCOUNTER — Encounter: Payer: Self-pay | Admitting: *Deleted

## 2018-10-18 LAB — CBC
HCT: 34.3 % — ABNORMAL LOW (ref 36.0–46.0)
Hemoglobin: 11.5 g/dL — ABNORMAL LOW (ref 12.0–15.0)
MCH: 27 pg (ref 26.0–34.0)
MCHC: 33.5 g/dL (ref 30.0–36.0)
MCV: 80.5 fL (ref 80.0–100.0)
NRBC: 0 % (ref 0.0–0.2)
PLATELETS: 106 10*3/uL — AB (ref 150–400)
RBC: 4.26 MIL/uL (ref 3.87–5.11)
RDW: 13.8 % (ref 11.5–15.5)
WBC: 11.5 10*3/uL — ABNORMAL HIGH (ref 4.0–10.5)

## 2018-10-18 LAB — RPR: RPR Ser Ql: NONREACTIVE

## 2018-10-18 MED ORDER — CYCLOBENZAPRINE HCL 10 MG PO TABS
10.0000 mg | ORAL_TABLET | Freq: Three times a day (TID) | ORAL | Status: DC | PRN
  Administered 2018-10-18 – 2018-10-19 (×2): 10 mg via ORAL
  Filled 2018-10-18 (×2): qty 1

## 2018-10-18 MED ORDER — BENZOCAINE-MENTHOL 20-0.5 % EX AERO: 1.0000 "application " | INHALATION_SPRAY | CUTANEOUS | Status: DC | PRN

## 2018-10-18 MED ORDER — WITCH HAZEL-GLYCERIN EX PADS: 1.0000 "application " | MEDICATED_PAD | CUTANEOUS | Status: DC | PRN

## 2018-10-18 MED ORDER — ONDANSETRON HCL 4 MG/2ML IJ SOLN: 4.0000 mg | INTRAMUSCULAR | Status: DC | PRN

## 2018-10-18 MED ORDER — ZOLPIDEM TARTRATE 5 MG PO TABS: 5.0000 mg | ORAL_TABLET | Freq: Every evening | ORAL | Status: DC | PRN

## 2018-10-18 MED ORDER — PRENATAL MULTIVITAMIN CH
1.0000 | ORAL_TABLET | Freq: Every day | ORAL | Status: DC
  Administered 2018-10-18: 1 via ORAL
  Filled 2018-10-18: qty 1

## 2018-10-18 MED ORDER — IBUPROFEN 600 MG PO TABS
600.0000 mg | ORAL_TABLET | Freq: Four times a day (QID) | ORAL | Status: DC
  Administered 2018-10-18 – 2018-10-19 (×6): 600 mg via ORAL
  Filled 2018-10-18 (×6): qty 1

## 2018-10-18 MED ORDER — ACETAMINOPHEN 325 MG PO TABS
650.0000 mg | ORAL_TABLET | ORAL | Status: DC | PRN
  Administered 2018-10-18: 650 mg via ORAL
  Filled 2018-10-18 (×2): qty 2

## 2018-10-18 MED ORDER — TETANUS-DIPHTH-ACELL PERTUSSIS 5-2.5-18.5 LF-MCG/0.5 IM SUSP: 0.5000 mL | Freq: Once | INTRAMUSCULAR | Status: DC

## 2018-10-18 MED ORDER — SENNOSIDES-DOCUSATE SODIUM 8.6-50 MG PO TABS
2.0000 | ORAL_TABLET | ORAL | Status: DC
  Administered 2018-10-18: 2 via ORAL

## 2018-10-18 MED ORDER — SIMETHICONE 80 MG PO CHEW: 80.0000 mg | CHEWABLE_TABLET | ORAL | Status: DC | PRN

## 2018-10-18 MED ORDER — ONDANSETRON HCL 4 MG PO TABS: 4.0000 mg | ORAL_TABLET | ORAL | Status: DC | PRN

## 2018-10-18 MED ORDER — DIPHENHYDRAMINE HCL 25 MG PO CAPS: 25.0000 mg | ORAL_CAPSULE | Freq: Four times a day (QID) | ORAL | Status: DC | PRN

## 2018-10-18 MED ORDER — COCONUT OIL OIL: 1.0000 "application " | TOPICAL_OIL | Status: DC | PRN

## 2018-10-18 MED ORDER — DIBUCAINE 1 % RE OINT: 1.0000 "application " | TOPICAL_OINTMENT | RECTAL | Status: DC | PRN

## 2018-10-18 MED ORDER — LIDOCAINE 5 % EX PTCH
2.0000 | MEDICATED_PATCH | CUTANEOUS | Status: DC
  Administered 2018-10-18: 2 via TRANSDERMAL
  Filled 2018-10-18: qty 2

## 2018-10-18 NOTE — Progress Notes (Signed)
Arabic Stratus interpreter ID# [id number] used for [facility] admission with patient. Plan of care discussed, safety information reviewed and signed, patient agrees and understands. No current questions or concerns at time.

## 2018-10-18 NOTE — Progress Notes (Signed)
Used stratus interpreter [phone] with patient to discuss her c/o pain. She states it feels "tight", rated at a "9". She says she is very uncomfortable in bed, unable to breastfeed her baby, cannot turn her head or fully raise her arms. RN again suggested it seems to be related to muscle pain from laboring and delivery.  RN offered and gave a back massage with heat packs, seemed to help little. Will call OB for possible order for an "icy hot" muscle cream.

## 2018-10-18 NOTE — Lactation Note (Signed)
This note was copied from a baby's chart. Lactation Consultation Note  Patient Name: Brenda Tate [id number] Date: 10/18/2018 Reason for consult: Initial assessment G3P3 vag delivery baby Brenda Kats now 60 hours old born at 12 weeks and 1 day gestation.  Mom reports she has no milk.  Showed mom how to hand express ans she is able to easily hand express drops of colostrum. Mom reports her other two children were not born in the [place] and that she did not have much milk with them.  Mom reports she breastfed for close to two months with them both.  Infant in crib cuing.  Asked mom if I could hand him to her.  She agreed.  Minimal assist with positioning and latch.  Urged mom to only give formula if medically indicated. Reviewed and left breastfeeding support services handout and cone consultation services handout. Left mom and baby breastfeeding.  Urged mom to call lactation as needed.  Maternal Data Has patient been taught Hand Expression?: Yes  Feeding Feeding Type: Breast Fed  LATCH Score Latch: Grasps breast easily, tongue down, lips flanged, rhythmical sucking.  Audible Swallowing: A few with stimulation  Type of Nipple: Everted at rest and after stimulation  Comfort (Breast/Nipple): Soft / non-tender  Hold (Positioning): Assistance needed to correctly position infant at breast and maintain latch.  LATCH Score: 8  Interventions Interventions: Breast feeding basics reviewed;Assisted with latch;Skin to skin;Hand express  Lactation Tools Discussed/Used     Consult Status Consult Status: Follow-up Date: 10/19/18 Follow-up type: In-patient    [facility] 10/18/2018, 4:53 PM

## 2018-10-18 NOTE — Progress Notes (Signed)
Post Partum Day 1 Subjective: Doing well but having some neck pain. Is worried it may be related to epidural because she states she moved when it was placed. Has trouble moving neck in any direction. Denies any arm tingling or weakness.   Objective: Blood pressure 96/62, pulse 82, temperature 98.2 F (36.8 C), temperature source Oral, resp. rate 17, height 5' 9.29" (1.76 m), weight 89.8 kg, last menstrual period 01/09/2018, SpO2 98 %, unknown if currently breastfeeding.  Physical Exam:  General: alert, well-appearing, breastfeeding  Neck/Neuro: paraspinal tenderness along lower cervicals and thoracic spine  strength 5/5 bilateral upper extremities  Lochia: appropriate Uterine Fundus: firm Incision: n/a DVT Evaluation: No significant calf/ankle edema.  Recent Labs    10/17/18 2053 10/18/18 0700  HGB 12.6 11.5*  HCT 38.7 34.3*    Assessment/Plan: Plan for discharge tomorrow  Will be in touch with anesthesia as FYI in case neck pain worsens - seems most consistent with muscle tension related to pushing  Lactation support prn    LOS: 1 day   Analina Filla S Mykal Batiz, DO 10/18/2018, 3:14 PM

## 2018-10-18 NOTE — Discharge Instructions (Signed)

## 2018-10-18 NOTE — Progress Notes (Addendum)
Called by patient and husband for c/o back, neck and shoulder pain. Husband says it is related to her epidural and is asking to speak with a Dr. Shelly Bombard explained it may be related to muscle pain with pushing and being slouched in bed position, offered tylenol and given heat packs. Tylenol refused, husband states heat packs are no good.  Husband adamant it is because of the epidural and wants a doctor to come see her. CRNA present on floor, notified of request.

## 2018-10-19 ENCOUNTER — Encounter: Payer: Self-pay | Admitting: *Deleted

## 2018-10-19 MED ORDER — IBUPROFEN 600 MG PO TABS: 600.0000 mg | ORAL_TABLET | Freq: Four times a day (QID) | ORAL | 0 refills | Status: DC

## 2018-10-19 MED ORDER — SENNOSIDES-DOCUSATE SODIUM 8.6-50 MG PO TABS: 2.0000 | ORAL_TABLET | ORAL | 0 refills | Status: DC

## 2018-10-19 MED ORDER — CYCLOBENZAPRINE HCL 10 MG PO TABS: 10.0000 mg | ORAL_TABLET | Freq: Three times a day (TID) | ORAL | 0 refills | Status: AC | PRN

## 2018-10-19 NOTE — Lactation Note (Signed)
This note was copied from a baby's chart. Lactation Consultation Note  Patient Name: Brenda Tate [id number] Date: 10/19/2018 Reason for consult: Follow-up assessment;Term Baby is 35 hours old/3% weight loss.  Video interpreter used for consult.  Mom states she has no milk.  Reviewed colostrum and milk coming to volume.  Mom chooses to bottle feed now because of her neck pain.  Instructed to put baby to breast prior to bottle as much as possible.  Encouraged to call for assist prn.  She denies questions or concerns.  Maternal Data    Feeding    LATCH Score                   Interventions    Lactation Tools Discussed/Used     Consult Status Consult Status: Complete Follow-up type: Call as needed    [name] 10/19/2018, 10:49 AM

## 2018-10-20 ENCOUNTER — Telehealth: Payer: Self-pay | Admitting: Obstetrics & Gynecology

## 2018-10-20 DIAGNOSIS — S46911A Strain of unspecified muscle, fascia and tendon at shoulder and upper arm level, right arm, initial encounter: Secondary | ICD-10-CM | POA: Diagnosis not present

## 2018-10-20 DIAGNOSIS — S46912A Strain of unspecified muscle, fascia and tendon at shoulder and upper arm level, left arm, initial encounter: Secondary | ICD-10-CM | POA: Diagnosis not present

## 2018-10-20 NOTE — Telephone Encounter (Signed)
The patient's husband called and stated his wife can not move as she is experiencing pains in her back. Informed the husband of visiting an urgent care and gave the number and address to the nearest clinic.

## 2018-11-15 ENCOUNTER — Ambulatory Visit: Payer: Self-pay | Admitting: Advanced Practice Midwife

## 2018-11-15 ENCOUNTER — Telehealth: Payer: Self-pay | Admitting: Family Medicine

## 2018-11-15 NOTE — Telephone Encounter (Signed)
The patient called in concerned about the missed appointment. Stated she was not notified of the new address. She also stated she was unaware that her appointment was cancelled. Apologized to the patient for the incovinence and rescheduled. Notified of the new address.

## 2018-11-29 ENCOUNTER — Ambulatory Visit: Payer: Medicaid Other | Admitting: Obstetrics & Gynecology

## 2018-11-29 ENCOUNTER — Other Ambulatory Visit: Payer: Self-pay

## 2018-11-29 ENCOUNTER — Telehealth: Payer: Self-pay | Admitting: Obstetrics and Gynecology

## 2018-11-29 ENCOUNTER — Ambulatory Visit: Payer: Self-pay | Admitting: Family Medicine

## 2018-11-29 NOTE — Telephone Encounter (Signed)
Called with interrupter id [id number] to ensure the patient is attending the appointment. She stated yes, and is aware of the new location.

## 2018-12-16 ENCOUNTER — Telehealth: Payer: Self-pay | Admitting: Obstetrics and Gynecology

## 2018-12-16 NOTE — Telephone Encounter (Signed)
Called the patient to confirm upcoming appointment. Received a message the person you are trying to reach has a voicemail box that has not been setup yet. Good-bye.

## 2018-12-17 ENCOUNTER — Ambulatory Visit (INDEPENDENT_AMBULATORY_CARE_PROVIDER_SITE_OTHER): Payer: Medicaid Other | Admitting: Obstetrics & Gynecology

## 2018-12-17 ENCOUNTER — Encounter: Payer: Self-pay | Admitting: Obstetrics & Gynecology

## 2018-12-17 ENCOUNTER — Telehealth: Payer: Self-pay | Admitting: Obstetrics & Gynecology

## 2018-12-17 ENCOUNTER — Other Ambulatory Visit: Payer: Self-pay

## 2018-12-17 DIAGNOSIS — Z1389 Encounter for screening for other disorder: Secondary | ICD-10-CM | POA: Diagnosis not present

## 2018-12-17 DIAGNOSIS — Z3202 Encounter for pregnancy test, result negative: Secondary | ICD-10-CM | POA: Diagnosis not present

## 2018-12-17 DIAGNOSIS — K5909 Other constipation: Secondary | ICD-10-CM

## 2018-12-17 DIAGNOSIS — Z30017 Encounter for initial prescription of implantable subdermal contraceptive: Secondary | ICD-10-CM | POA: Diagnosis not present

## 2018-12-17 LAB — POCT PREGNANCY, URINE: Preg Test, Ur: NEGATIVE

## 2018-12-17 MED ORDER — PRENATAL VITAMINS 28-0.8 MG PO TABS: 1.0000 | ORAL_TABLET | Freq: Every day | ORAL | 3 refills | Status: DC

## 2018-12-17 MED ORDER — SENNOSIDES-DOCUSATE SODIUM 8.6-50 MG PO TABS: 1.0000 | ORAL_TABLET | Freq: Two times a day (BID) | ORAL | 2 refills | Status: DC | PRN

## 2018-12-17 MED ORDER — ETONOGESTREL 68 MG ~~LOC~~ IMPL
68.0000 mg | DRUG_IMPLANT | Freq: Once | SUBCUTANEOUS | Status: AC
  Administered 2018-12-17: 68 mg via SUBCUTANEOUS

## 2018-12-17 NOTE — Patient Instructions (Signed)
Nexplanon Instructions After Insertion   Keep bandage clean and dry for 24 hours   May use ice/Tylenol/Ibuprofen for soreness or pain   If you develop fever, drainage or increased warmth from incision site-contact office immediately   Constipation, Adult Constipation is when a person has fewer bowel movements in a week than normal, has difficulty having a bowel movement, or has stools that are dry, hard, or larger than normal. Constipation may be caused by an underlying condition. It may become worse with age if a person takes certain medicines and does not take in enough fluids. Follow these instructions at home: Eating and drinking   Eat foods that have a lot of fiber, such as fresh fruits and vegetables, whole grains, and beans.  Limit foods that are high in fat, low in fiber, or overly processed, such as french fries, hamburgers, cookies, candies, and soda.  Drink enough fluid to keep your urine clear or pale yellow. General instructions  Exercise regularly or as told by your health care provider.  Go to the restroom when you have the urge to go. Do not hold it in.  Take over-the-counter and prescription medicines only as told by your health care provider. These include any fiber supplements.  Practice pelvic floor retraining exercises, such as deep breathing while relaxing the lower abdomen and pelvic floor relaxation during bowel movements.  Watch your condition for any changes.  Keep all follow-up visits as told by your health care provider. This is important. Contact a health care provider if:  You have pain that gets worse.  You have a fever.  You do not have a bowel movement after 4 days.  You vomit.  You are not hungry.  You lose weight.  You are bleeding from the anus.  You have thin, pencil-like stools. Get help right away if:  You have a fever and your symptoms suddenly get worse.  You leak stool or have blood in your stool.  Your abdomen is  bloated.  You have severe pain in your abdomen.  You feel dizzy or you faint. This information is not intended to replace advice given to you by your health care provider. Make sure you discuss any questions you have with your health care provider. Document Released: 04/11/2004 Document Revised: 02/01/2016 Document Reviewed: 01/02/2016 Elsevier Interactive Patient Education  2019 [employer].

## 2018-12-17 NOTE — Progress Notes (Signed)
Subjective:     Brenda Tate is a 38 y.o. G68P3003 female who presents for a postpartum visit. She is 8 weeks postpartum following a spontaneous vaginal delivery. I have fully reviewed the prenatal and intrapartum course. The delivery was at 40.1 gestational weeks. Outcome: spontaneous vaginal delivery. Anesthesia: epidural. Postpartum course has been unremarkable. Baby's course has been unremarkable. Baby is feeding by breast. Bleeding no bleeding. Bowel function is abnormal: patient reports some constipation. Bladder function is normal. Patient is sexually active. Contraception method is condoms. Patient is interested in [place]. Postpartum depression screening: negative.  The following portions of the patient's history were reviewed and updated as appropriate: allergies, current medications, past family history, past medical history, past social history, past surgical history and problem list. Normal pap on 12/09/2016  Review of Systems Pertinent items noted in HPI and remainder of comprehensive ROS otherwise negative.   Objective:    BP 102/70   Pulse (!) 101   Wt 177 lb (80.3 kg)   LMP 01/09/2018 (Exact Date)   Breastfeeding Yes   BMI 25.92 kg/m   General:  alert and no distress   Breasts:  inspection negative, no nipple discharge or bleeding, no masses or nodularity palpable  Abdomen: soft, non-tender; bowel sounds normal; no masses,  no organomegaly  Pelvic:  not evaluated   Nexplanon Insertion Procedure Patient identified, informed consent performed, consent signed.   Patient does understand that irregular bleeding is a very common side effect of this medication. She was advised to have backup contraception for one week after placement. Pregnancy test in clinic today was negative.  Appropriate time out taken.  Patient's left arm was prepped and draped in the usual sterile fashion. The ruler used to measure and mark insertion area.  Patient was prepped with alcohol swab and then  injected with 3 ml of 1% lidocaine.  She was prepped with betadine, Nexplanon removed from packaging,  Device confirmed in needle, then inserted full length of needle and withdrawn per handbook instructions. Nexplanon was able to palpated in the patient's arm; patient palpated the insert herself. There was minimal blood loss.  Patient insertion site covered with guaze and a pressure bandage to reduce any bruising.  The patient tolerated the procedure well and was given post procedure instructions.       Assessment:   Normal postpartum exam. Nexplanon placed.  Patient reports having some constipation, desires treatment.   Plan:     1. Other constipation - senna-docusate (SENOKOT-S) 8.6-50 MG tablet; Take 1 tablet by mouth 2 (two) times daily as needed for mild constipation.  Dispense: 30 tablet; Refill: 2  2. Postpartum care following vaginal delivery PNVs refilled as per her request. - Prenatal Vit-Fe Fumarate-FA (PRENATAL VITAMINS) 28-0.8 MG TABS; Take 1 tablet by mouth daily.  Dispense: 60 tablet; Refill: 3 - etonogestrel (NEXPLANON) implant 68 mg  3. Nexplanon insertion - etonogestrel (NEXPLANON) implant 68 mg placed today.   Verita Schneiders, MD, [place] for [employer], [place]

## 2018-12-17 NOTE — Telephone Encounter (Signed)
The interrupter returned the call back. Informed the nurse is using an alternative method.

## 2019-04-19 DIAGNOSIS — H1013 Acute atopic conjunctivitis, bilateral: Secondary | ICD-10-CM | POA: Diagnosis not present

## 2019-05-12 DIAGNOSIS — H40033 Anatomical narrow angle, bilateral: Secondary | ICD-10-CM | POA: Diagnosis not present

## 2019-05-12 DIAGNOSIS — H16223 Keratoconjunctivitis sicca, not specified as Sjogren's, bilateral: Secondary | ICD-10-CM | POA: Diagnosis not present

## 2019-05-13 DIAGNOSIS — H5213 Myopia, bilateral: Secondary | ICD-10-CM | POA: Diagnosis not present

## 2019-05-31 DIAGNOSIS — H1013 Acute atopic conjunctivitis, bilateral: Secondary | ICD-10-CM | POA: Diagnosis not present

## 2019-05-31 DIAGNOSIS — H5213 Myopia, bilateral: Secondary | ICD-10-CM | POA: Diagnosis not present

## 2020-06-15 IMAGING — US US MFM OB FOLLOW UP
1 series · 13 of 28 positions shown · non-contrast
Comparison: none

[Series 1: us mfm ob follow up · 13 of 31 slices shown]
[im 2/31]
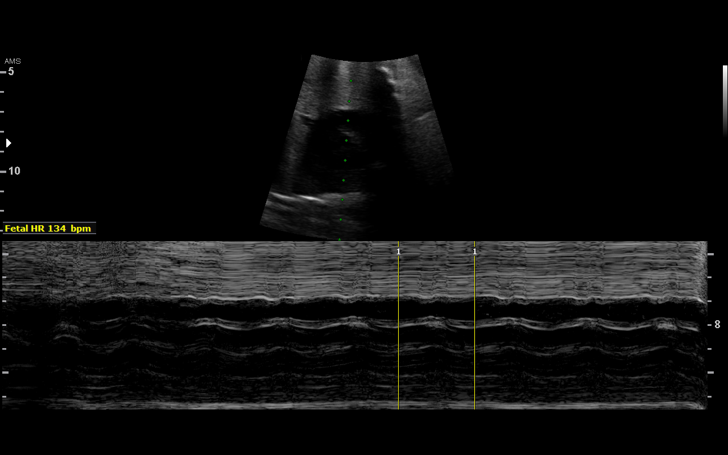
[im 4/31]
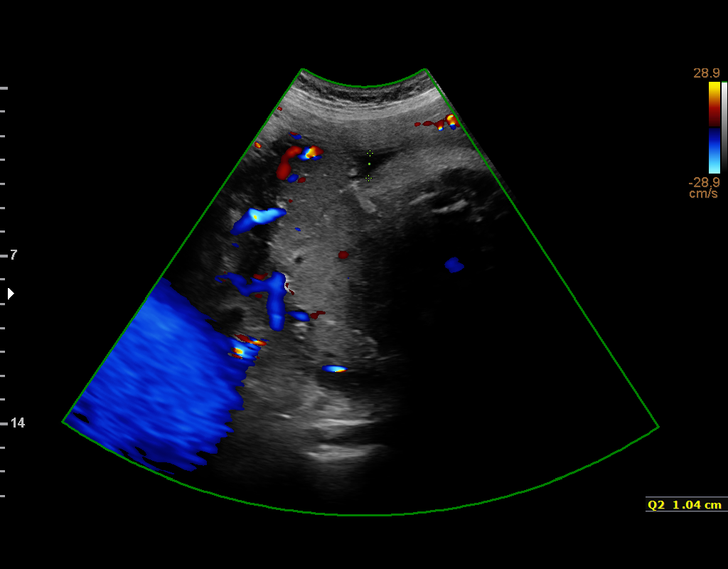
[im 6/31]
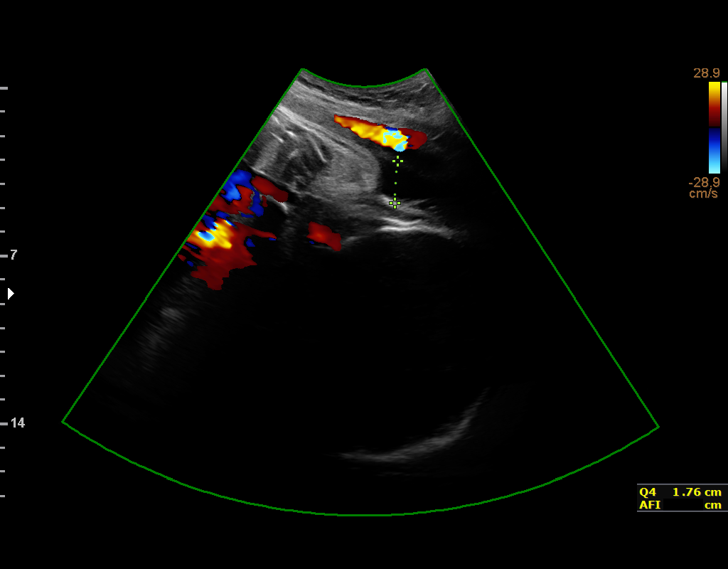
[im 8/31]
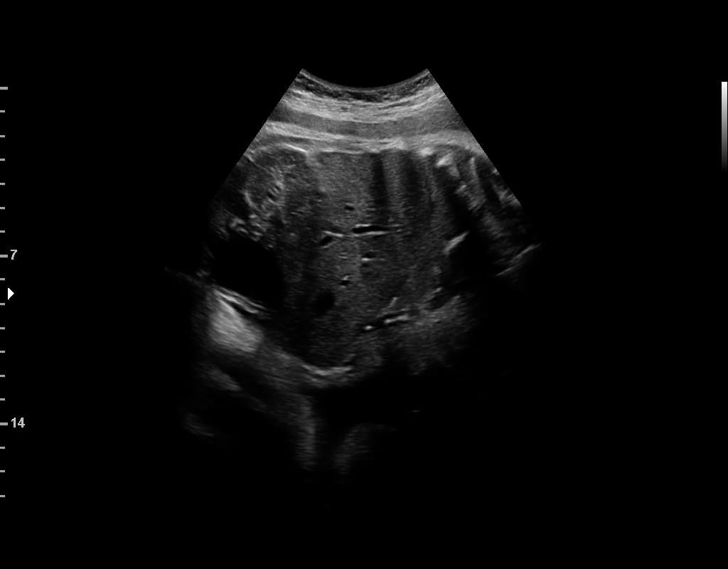
[im 11/31]
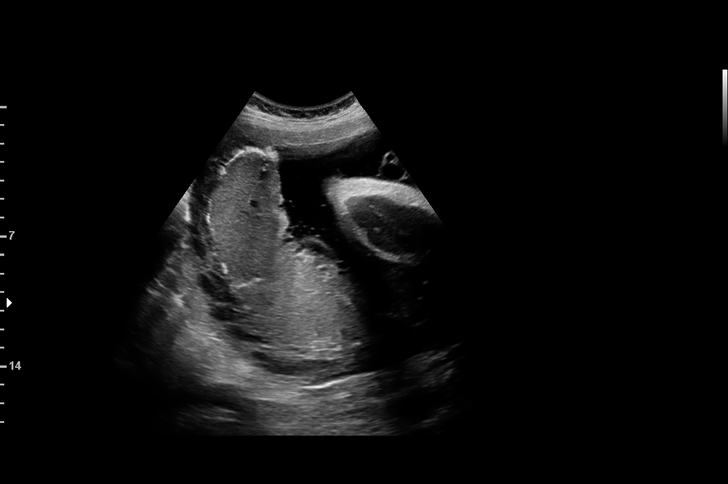
[im 13/31]
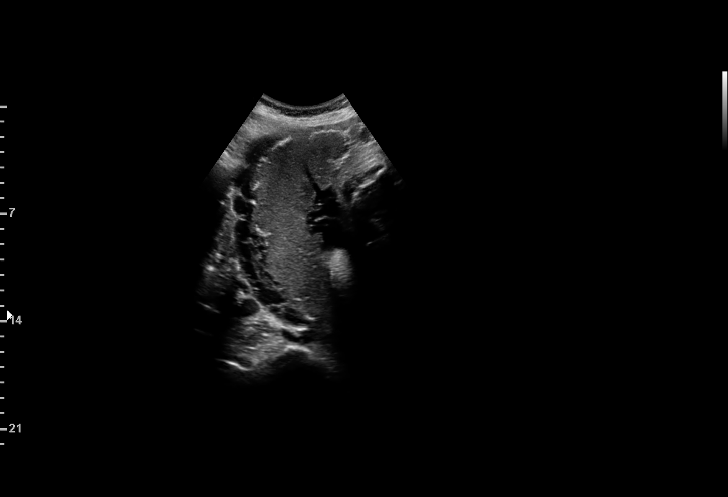
[im 16/31]
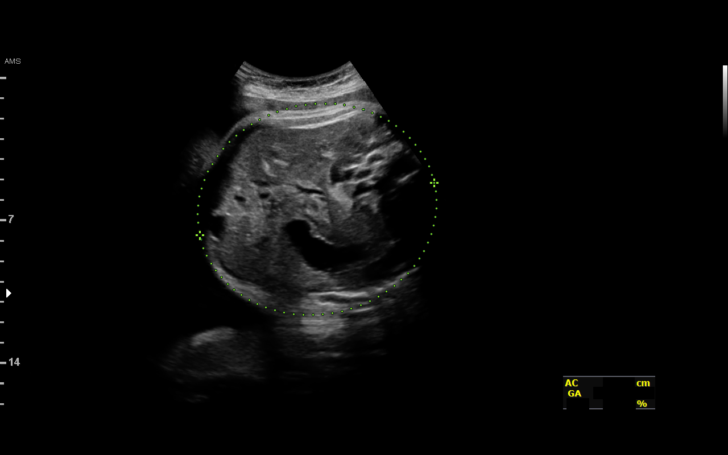
[im 18/31]
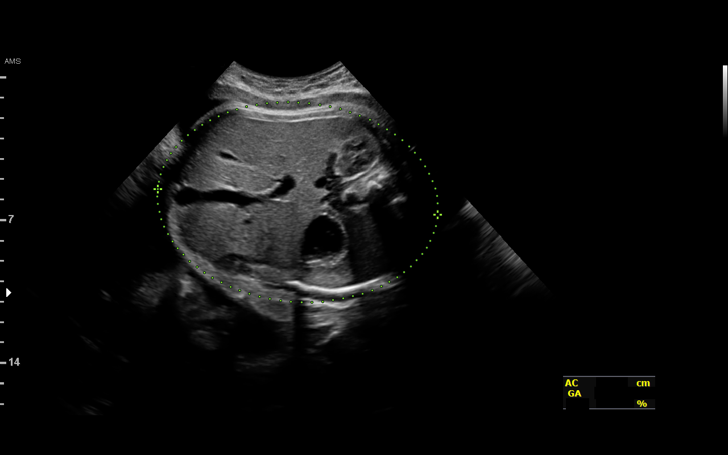
[im 21/31]
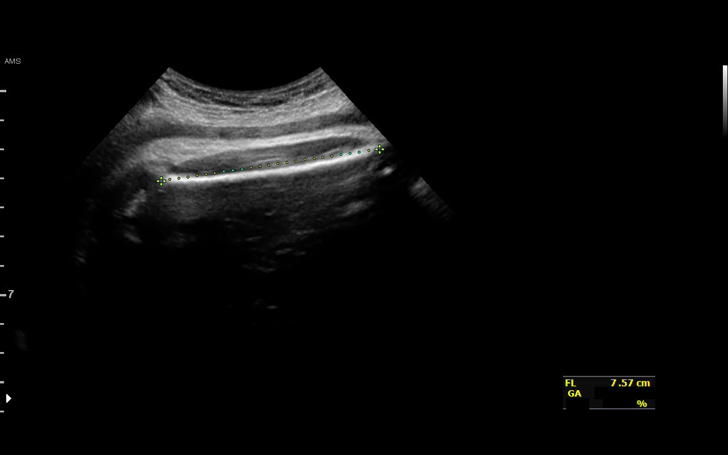
[im 23/31]
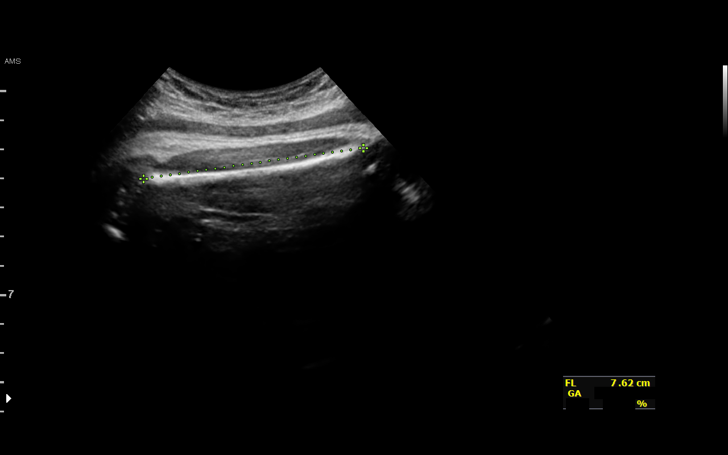
[im 25/31]
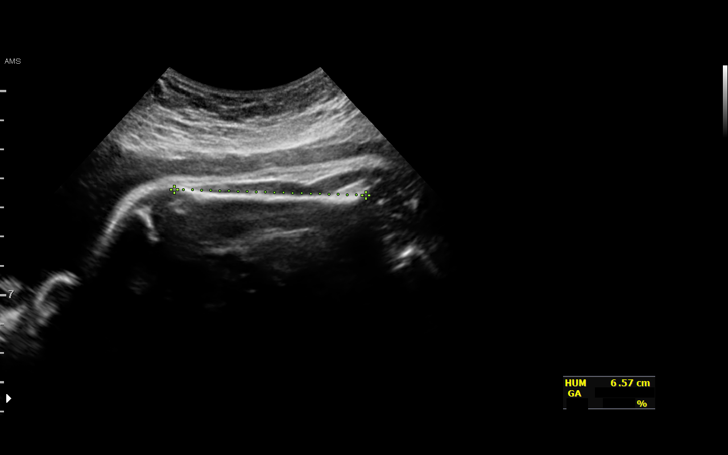
[im 27/31]
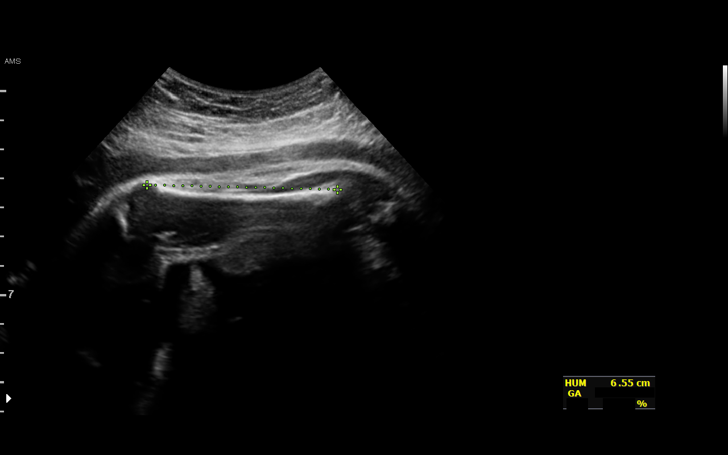
[im 29/31]
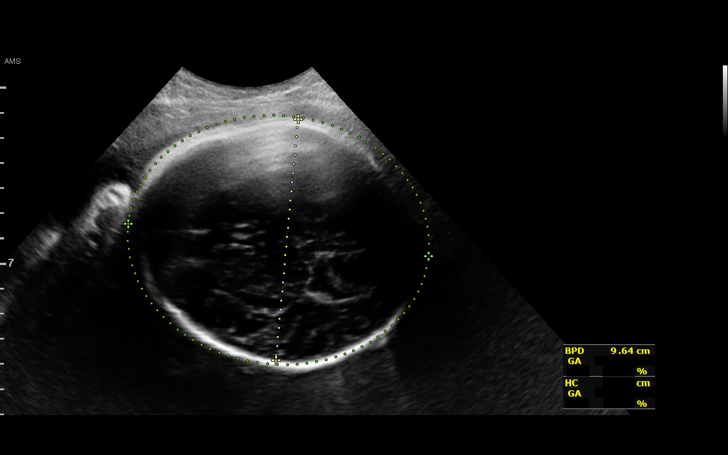

[13 of 28 positions shown; findings below may reference images not displayed]

OB/Gyn Clinic

 ----------------------------------------------------------------------

 ----------------------------------------------------------------------
Indications

  Macrosomia >90%tile
  Advanced maternal age multigravida 37,
  third trimester
  Fetal abnormality - other known or
  suspected (RT renal pyelectasis)
  38 weeks gestation of pregnancy
 ----------------------------------------------------------------------
Vital Signs

                                                Height:        5'5"
Fetal Evaluation

 Num Of Fetuses:         1
 Fetal Heart Rate(bpm):  134
 Cardiac Activity:       Observed
 Presentation:           Cephalic
 Placenta:               Posterior
 P. Cord Insertion:      Previously Visualized

 Amniotic Fluid
 AFI FV:      Within normal limits

 AFI Sum(cm)     %Tile       Largest Pocket(cm)
 11.39           37

 RUQ(cm)       RLQ(cm)       LUQ(cm)        LLQ(cm)

Biometry

 BPD:      96.7  mm     G. Age:  39w 4d         94  %    CI:        78.79   %    70 - 86
                                                         FL/HC:      22.1   %    20.9 -
 HC:      344.5  mm     G. Age:  39w 6d         69  %    HC/AC:      0.94        0.92 -
 AC:      366.5  mm     G. Age:  40w 4d       > 97  %    FL/BPD:     78.7   %    71 - 87
 FL:       76.1  mm     G. Age:  39w 0d         69  %    FL/AC:      20.8   %    20 - 24
 HUM:      65.7  mm     G. Age:  38w 1d         74  %

 Est. FW:    5761  gm    8 lb 12 oz    > 90  %
OB History

 Gravidity:    3         Term:   2
 Living:       2
Gestational Age

 LMP:           38w 2d        Date:  01/09/18                 EDD:   10/16/18
 U/S Today:     39w 5d                                        EDD:   10/06/18
 Best:          38w 2d     Det. By:  LMP  (01/09/18)          EDD:   10/16/18
Anatomy

 Cranium:               Appears normal         Aortic Arch:            Previously seen
 Cavum:                 Previously seen        Ductal Arch:            Previously seen
 Ventricles:            Appears normal         Diaphragm:              Previously seen
 Choroid Plexus:        Previously seen        Stomach:                Appears normal, left
                                                                       sided
 Cerebellum:            Previously seen        Abdomen:                Appears normal
 Posterior Fossa:       Previously seen        Abdominal Wall:         Previously seen
 Nuchal Fold:           Previously seen        Cord Vessels:           Previously seen
 Face:                  Orbits appear          Kidneys:                Appear normal
                        normal
 Lips:                  Previously seen        Bladder:                Appears normal
 Thoracic:              Previously seen        Spine:                  Previously seen
 Heart:                 Previously seen        Upper Extremities:      Previously seen
 RVOT:                  Previously seen        Lower Extremities:      Previously seen
 LVOT:                  Previously seen

 Other:  Heels and 5th digit previously seen. Nasal bone previously seen.
         Technically difficult due to fetal position.
Impression

 The estimated fetal weight is at greater than the 90th
 percentile. Abdominal circumference measures at greater
 than the 95th percentile. Amniotic fluid is normal and good
 fetal activity is seen.

 Patient understands the limitations of ultrasound in accurately
 estimating fetal weights.
Recommendations

 -Follow-up as clinically indicated.
                 [place], [place]

## 2021-04-23 DIAGNOSIS — R7303 Prediabetes: Secondary | ICD-10-CM | POA: Insufficient documentation

## 2021-05-25 DIAGNOSIS — H5213 Myopia, bilateral: Secondary | ICD-10-CM | POA: Diagnosis not present

## 2021-06-22 ENCOUNTER — Emergency Department (HOSPITAL_COMMUNITY)
Admission: EM | Admit: 2021-06-22 | Discharge: 2021-06-22 | Disposition: A | Discharge status: Home / Self Care | Payer: 59 | Attending: Emergency Medicine | Admitting: Emergency Medicine

## 2021-06-22 ENCOUNTER — Other Ambulatory Visit: Payer: Self-pay

## 2021-06-22 DIAGNOSIS — Z5321 Procedure and treatment not carried out due to patient leaving prior to being seen by health care provider: Secondary | ICD-10-CM | POA: Diagnosis not present

## 2021-06-22 DIAGNOSIS — M79602 Pain in left arm: Secondary | ICD-10-CM | POA: Diagnosis not present

## 2021-06-22 NOTE — ED Notes (Signed)
Pt stated that she could not wait any longer that she had a new born baby at home that she needed to attend to.

## 2021-06-24 ENCOUNTER — Telehealth: Payer: Self-pay

## 2021-06-24 NOTE — Telephone Encounter (Signed)
Transition Care Management Unsuccessful Follow-up Telephone Call  Date of discharge and from where:  06/22/2021 from [facility]  Attempts:  1st Attempt  Reason for unsuccessful TCM follow-up call:  Left voice message

## 2021-06-25 NOTE — Telephone Encounter (Signed)
Transition Care Management Unsuccessful Follow-up Telephone Call  Date of discharge and from where:  06/22/2021 from [facility]  Attempts:  2nd Attempt  Reason for unsuccessful TCM follow-up call:  Left voice message

## 2021-06-26 NOTE — Telephone Encounter (Signed)
Transition Care Management Follow-up Telephone Call Date of discharge and from where: 06/22/2021-[facility] Before Triage  How have you been since you were released from the hospital? Lwbs before Triage. Patient states she is ok.  Any questions or concerns? No

## 2021-07-11 ENCOUNTER — Encounter: Payer: 59 | Admitting: Internal Medicine

## 2021-07-11 ENCOUNTER — Ambulatory Visit (INDEPENDENT_AMBULATORY_CARE_PROVIDER_SITE_OTHER): Payer: 59 | Admitting: Internal Medicine

## 2021-07-11 VITALS — BP 117/80 | HR 105 | Temp 98.4°F | Wt 211.8 lb

## 2021-07-11 DIAGNOSIS — R413 Other amnesia: Secondary | ICD-10-CM | POA: Insufficient documentation

## 2021-07-11 NOTE — Patient Instructions (Addendum)
???? ??? ?????? ????? ???? ??????? ?????. ??? ????? ?? ????? ?????! ?????? ????? ????? ?????? ???? ???? ??? ?????? ??? ???? ??????? ????? ?? ????? ?? ?????? ? ???????? ??? ????????? ??????? ???? ?? ????? ?? ???? ???? ?????. ??? ???? ????? ?? ????? ???? ????? ???? ???   Tylenol ???? ?? ?????. ??? ?????? ? ????? ??? ???? HbA1c ???? ???? 6.2? ? ??? ??? ???? ?? ???? ?????? ?????? ? ??? ????? ??????? ?? ??? ?????? ??? ????? ???? ????? ??? ??????? ??????? ???? ?? ????? ?? ?????? ?? ????? ??????? ???? ??????.  ??? ????? ?? ????? ?? ?????? ???????? ?? [facility] ???????. ???? ???? ???????? ???? ?? 24 ????? ? ????? ??? ??????? ?? 15 ????.  ????? ??? ? ??   shukran lakum liziarat eiadat altibi albatinii alyawma. kan liqayik min dawaei sruri! naqashna alyawm suqutak al'akhir aladhi qadak 'iilaa aldhahab 'iilaa ghurfat altawari walakinak lam tatamakan min albaqa' , bial'iidafat 'iilaa almukhtabarat alhadithat alati tama jameuha fi maktab tabib mukhtalifin. 'ana saeid lisamae 'ana miesamak tasheur bitahasun kabir wa'ana Tylenol saeid fi al'almi. kama naqashna , nzran li'ana nisbat HbA1c ladayk tablugh 6.2% , fa'iina hadha yadaeuk fi nitaq muqadimat [place]ackett 'iijra' taghyirat fi namat alhayaat mithl aitibae nizam ghidhayiyin sihiyin wamumarasat alriyadat yumkin 'an yusaeid fi himayatik min tawqit al'iisabat bimarad alsukari. laqad 'aerabt ean raghbatik fi mutabaeat almutabaeat mae [facility] lirieayatika. ladayk maweid lilmutabaeat maeahum fi 78 yanayir , wamaweid litibi al'aesab fi 15 mars. 'iimili din , du

## 2021-07-11 NOTE — Progress Notes (Signed)
Patient presented today for what she believed was a neurology appointment.  She presented to [facility] emergency department on November 26 however she had to leave prior to being seen.  She received a call letting her know that she had an appointment with [place] today, however due to a language barrier, it was not made clear to her that this was to a primary care office and not her neurology referral.  She is currently followed by [facility] for her primary care needs which is where she wishes to continue receiving primary care.  I have informed the patient that her follow-up visit with her current primary care physician is on January 24 and that her neurology appointment is on March 15.

## 2021-12-17 ENCOUNTER — Encounter: Payer: Self-pay | Admitting: *Deleted

## 2021-12-17 DIAGNOSIS — R7309 Other abnormal glucose: Secondary | ICD-10-CM

## 2021-12-17 LAB — GLUCOSE, POCT (MANUAL RESULT ENTRY): POC Glucose: 146 mg/dL — AB (ref 70–99)

## 2021-12-17 NOTE — Congregational Nurse Program (Signed)
Dept: [phone]   Congregational Nurse Program Note  Date of Encounter: 12/17/2021  Past Medical History: Past Medical History:  Diagnosis Date   Encounter for supervision of normal first pregnancy in second trimester 05/11/2018    Nursing Staff Provider Office Location [place]  Dating  Certain LMP  Language  Arabic  Anatomy US  Scheduled  Flu Vaccine  05/11/18 Genetic Screen  NIPS:  Low risk female AFP: negative TDaP vaccine    Hgb A1C or  GTT Early  Third trimester  Rhogam     LAB RESULTS  Feeding Plan Breast  Blood Type B/Positive/-- (10/15 1555)  Contraception Undecided  Antibody Negative (10/15 1555) Circumcision If boy,    Encounter Details:  CNP Questionnaire - 12/17/21 1243       Questionnaire   Do you give verbal consent to treat you today? Yes    Location Patient [place] or Organization    Patient Status Immigrant    Insurance [employer] Referral N/A    Medication N/A    Medical Provider Yes    Screening Referrals N/A    Medical Referral N/A    Medical Appointment Made N/A    Food N/A    Transportation N/A    Housing/Utilities N/A    Interpersonal Safety N/A    Intervention Blood pressure;Blood glucose    ED Visit Averted Yes    Life-Saving Intervention Made N/A               Dept: [phone]   Congregational Nurse Program Note  Date of Encounter: 12/17/2021  Past Medical History: Past Medical History:  Diagnosis Date   Encounter for supervision of normal first pregnancy in second trimester 05/11/2018    Nursing Staff Provider Office Location [place]  Dating  Certain LMP  Language  Arabic  Anatomy US  Scheduled  Flu Vaccine  05/11/18 Genetic Screen  NIPS:  Low risk female AFP: negative TDaP vaccine    Hgb A1C or  GTT Early  Third trimester  Rhogam     LAB RESULTS  Feeding Plan Breast  Blood Type B/Positive/-- (10/15 1555)  Contraception Undecided  Antibody Negative (10/15 1555) Circumcision If boy,     Encounter Details:  CNP Questionnaire - 12/17/21 1243       Questionnaire   Do you give verbal consent to treat you today? Yes    Location Patient [place] or Organization    Patient Status Immigrant    Insurance [employer] Referral N/A    Medication N/A    Medical Provider Yes    Screening Referrals N/A    Medical Referral N/A    Medical Appointment Made N/A    Food N/A    Transportation N/A    Housing/Utilities N/A    Interpersonal Safety N/A    Intervention Blood pressure;Blood glucose    ED Visit Averted Yes    Life-Saving Intervention Made N/A            Client came into community center at [place] for BP and BS check.  BP 100/80 and fingerstick blood sugar 146.  Client tells me that she is pre-diabetic and wanted to be checked today.  Client tells me that she saw an MD about 3 months ago.  Encouraged client to return to this CN for check on any Tuesday from 12 noon to 2 pm.  Karene Fry, RN, MSN, [place] [facility] [phone] Cell

## 2022-02-15 DIAGNOSIS — R21 Rash and other nonspecific skin eruption: Secondary | ICD-10-CM | POA: Diagnosis not present

## 2022-03-27 ENCOUNTER — Other Ambulatory Visit (HOSPITAL_COMMUNITY)
Admission: RE | Admit: 2022-03-27 | Discharge: 2022-03-27 | Disposition: A | Discharge status: Home / Self Care | Payer: Medicaid Other | Source: Ambulatory Visit | Attending: Family Medicine | Admitting: Family Medicine

## 2022-03-27 ENCOUNTER — Ambulatory Visit: Payer: Medicaid Other | Admitting: Family Medicine

## 2022-03-27 ENCOUNTER — Other Ambulatory Visit: Payer: Self-pay

## 2022-03-27 ENCOUNTER — Encounter: Payer: Self-pay | Admitting: Family Medicine

## 2022-03-27 VITALS — BP 101/70 | HR 92 | Wt 205.8 lb

## 2022-03-27 DIAGNOSIS — Z124 Encounter for screening for malignant neoplasm of cervix: Secondary | ICD-10-CM | POA: Diagnosis not present

## 2022-03-27 DIAGNOSIS — Z3046 Encounter for surveillance of implantable subdermal contraceptive: Secondary | ICD-10-CM | POA: Diagnosis not present

## 2022-03-27 DIAGNOSIS — N816 Rectocele: Secondary | ICD-10-CM

## 2022-03-27 MED ORDER — ETONOGESTREL 68 MG ~~LOC~~ IMPL
68.0000 mg | DRUG_IMPLANT | Freq: Once | SUBCUTANEOUS | Status: AC
  Administered 2022-03-27: 68 mg via SUBCUTANEOUS

## 2022-03-27 NOTE — Progress Notes (Signed)
     GYNECOLOGY OFFICE NOTE  Brenda Tate is a 41 y.o. [id number] here for Nexplanon removal and Nexplanon re-insertion.  Needs pap smear. Has a small bulge when sitting on toilet that she wants checked out.  No other gynecologic concerns.  Physical Exam Exam conducted with a chaperone present.  Constitutional:      General: She is not in acute distress.    Appearance: She is well-developed.  HENT:     Head: Normocephalic and atraumatic.  Eyes:     General: No scleral icterus. Cardiovascular:     Rate and Rhythm: Normal rate.  Pulmonary:     Effort: Pulmonary effort is normal.  Abdominal:     Palpations: Abdomen is soft.  Genitourinary:    Comments: BUS normal, vagina is pink and rugated, cervix is parous without lesion, uterus is small and anteverted, no adnexal mass or tenderness. There is a small rectocele.  Musculoskeletal:     Cervical back: Neck supple.  Skin:    General: Skin is warm and dry.  Neurological:     Mental Status: She is alert and oriented to person, place, and time.      Procedure Note: Nexplanon Removal and Reinsertion Patient identified, informed consent performed, consent signed.   Patient does understand that irregular bleeding is a very common side effect of this medication. She was advised to have backup contraception for one week after replacement of the implant. Pregnancy test in clinic today was negative.  Appropriate time out taken. Nexplanon site identified in left arm.  Area prepped in usual sterile fashon. Two ml of 1% lidocaine was used to anesthetize the area of the implant. A small stab incision was made right beside the implant on the distal portion. The Nexplanon rod was grasped using hemostats and removed without difficulty. There was minimal blood loss. There were no complications.    Nexplanon removed from packaging, Device confirmed in needle, then inserted full length of needle and withdrawn per handbook instructions. Nexplanon was  able to palpated in the patient's arm; patient palpated the insert herself.  There was minimal blood loss. Patient insertion site covered with gauze and a pressure bandage to reduce any bruising. The patient tolerated the procedure well and was given post procedure instructions.    Assessment & Plan:  Screening for cervical cancer - Plan: Cytology - PAP( [place])  Encounter for removal and reinsertion of Nexplanon - Plan: etonogestrel (NEXPLANON) implant 68 mg  Rectocele - advised to return if worsens    Return if symptoms worsen or fail to improve.   Donnamae Jude, MD 03/27/2022 4:13 PM

## 2022-04-02 LAB — CYTOLOGY - PAP
Comment: NEGATIVE
Diagnosis: NEGATIVE
High risk HPV: NEGATIVE

## 2022-09-07 DIAGNOSIS — J069 Acute upper respiratory infection, unspecified: Secondary | ICD-10-CM | POA: Diagnosis not present

## 2022-09-07 DIAGNOSIS — R059 Cough, unspecified: Secondary | ICD-10-CM | POA: Diagnosis not present

## 2022-09-07 DIAGNOSIS — R0981 Nasal congestion: Secondary | ICD-10-CM | POA: Diagnosis not present

## 2022-09-24 DIAGNOSIS — N3001 Acute cystitis with hematuria: Secondary | ICD-10-CM | POA: Diagnosis not present

## 2022-10-01 DIAGNOSIS — R7303 Prediabetes: Secondary | ICD-10-CM | POA: Diagnosis not present

## 2022-11-10 DIAGNOSIS — R519 Headache, unspecified: Secondary | ICD-10-CM | POA: Diagnosis not present

## 2023-05-13 DIAGNOSIS — E559 Vitamin D deficiency, unspecified: Secondary | ICD-10-CM | POA: Diagnosis not present

## 2023-05-13 DIAGNOSIS — E1140 Type 2 diabetes mellitus with diabetic neuropathy, unspecified: Secondary | ICD-10-CM | POA: Diagnosis not present

## 2023-05-13 DIAGNOSIS — G8929 Other chronic pain: Secondary | ICD-10-CM | POA: Diagnosis not present

## 2023-05-13 DIAGNOSIS — Z7689 Persons encountering health services in other specified circumstances: Secondary | ICD-10-CM | POA: Diagnosis not present

## 2023-05-13 DIAGNOSIS — M545 Low back pain, unspecified: Secondary | ICD-10-CM | POA: Diagnosis not present

## 2023-05-13 DIAGNOSIS — Z1231 Encounter for screening mammogram for malignant neoplasm of breast: Secondary | ICD-10-CM | POA: Diagnosis not present

## 2023-06-16 ENCOUNTER — Telehealth: Payer: Self-pay | Admitting: Family Medicine

## 2023-06-16 NOTE — Telephone Encounter (Signed)
Patient's husband called and needs a nurse to call back patient with interpreter  regarding the situation of the nexplanon.

## 2023-06-18 NOTE — Telephone Encounter (Signed)
Called pt with [facility] Interpreter # [phone] and pt stated that her husband was advised to call the office.  Pt states that she is unsure for the call.  I advised pt that if she has concerns or questions for gyn to give [place] a call.    Brenda Tate

## 2023-08-14 DIAGNOSIS — E559 Vitamin D deficiency, unspecified: Secondary | ICD-10-CM | POA: Diagnosis not present

## 2023-09-06 DIAGNOSIS — J029 Acute pharyngitis, unspecified: Secondary | ICD-10-CM | POA: Diagnosis not present

## 2023-09-06 DIAGNOSIS — H5213 Myopia, bilateral: Secondary | ICD-10-CM | POA: Diagnosis not present

## 2023-09-06 DIAGNOSIS — R509 Fever, unspecified: Secondary | ICD-10-CM | POA: Diagnosis not present

## 2023-09-06 DIAGNOSIS — Z20822 Contact with and (suspected) exposure to covid-19: Secondary | ICD-10-CM | POA: Diagnosis not present

## 2023-09-11 DIAGNOSIS — R051 Acute cough: Secondary | ICD-10-CM | POA: Diagnosis not present

## 2023-11-20 DIAGNOSIS — E1141 Type 2 diabetes mellitus with diabetic mononeuropathy: Secondary | ICD-10-CM | POA: Diagnosis not present

## 2023-11-20 DIAGNOSIS — Z0000 Encounter for general adult medical examination without abnormal findings: Secondary | ICD-10-CM | POA: Diagnosis not present

## 2023-11-20 DIAGNOSIS — Z1329 Encounter for screening for other suspected endocrine disorder: Secondary | ICD-10-CM | POA: Diagnosis not present
